# Patient Record
Sex: Female | Born: 1935 | Race: White | Hispanic: No | Marital: Married | Smoking: Never smoker
Health system: Southern US, Community
[De-identification: ages and names within clinical notes are randomized; demographics above are authoritative.]

## PROBLEM LIST (undated history)

## (undated) DIAGNOSIS — K219 Gastro-esophageal reflux disease without esophagitis: Secondary | ICD-10-CM

## (undated) DIAGNOSIS — I10 Essential (primary) hypertension: Secondary | ICD-10-CM

## (undated) DIAGNOSIS — M1990 Unspecified osteoarthritis, unspecified site: Secondary | ICD-10-CM

## (undated) DIAGNOSIS — K529 Noninfective gastroenteritis and colitis, unspecified: Secondary | ICD-10-CM

## (undated) HISTORY — PX: EYE SURGERY: SHX253

## (undated) HISTORY — PX: FRACTURE SURGERY: SHX138

## (undated) HISTORY — PX: SHOULDER ARTHROSCOPY W/ ROTATOR CUFF REPAIR: SHX2400

## (undated) HISTORY — PX: WRIST FRACTURE SURGERY: SHX121

## (undated) HISTORY — PX: ABDOMINAL HYSTERECTOMY: SHX81

---

## 2003-02-26 ENCOUNTER — Ambulatory Visit (HOSPITAL_COMMUNITY): Admission: RE | Admit: 2003-02-26 | Discharge: 2003-02-27 | Payer: Self-pay | Admitting: Orthopedic Surgery

## 2003-02-26 ENCOUNTER — Encounter: Payer: Self-pay | Admitting: Orthopedic Surgery

## 2004-10-13 ENCOUNTER — Ambulatory Visit: Payer: Self-pay | Admitting: Gastroenterology

## 2005-08-23 ENCOUNTER — Ambulatory Visit: Payer: Self-pay | Admitting: Family Medicine

## 2006-10-04 ENCOUNTER — Ambulatory Visit: Payer: Self-pay | Admitting: Family Medicine

## 2006-10-07 ENCOUNTER — Ambulatory Visit: Payer: Self-pay | Admitting: Family Medicine

## 2007-04-20 ENCOUNTER — Ambulatory Visit: Payer: Self-pay | Admitting: Family Medicine

## 2007-11-02 ENCOUNTER — Ambulatory Visit: Payer: Self-pay | Admitting: Family Medicine

## 2008-11-28 ENCOUNTER — Ambulatory Visit: Payer: Self-pay | Admitting: Family Medicine

## 2009-09-15 ENCOUNTER — Ambulatory Visit: Payer: Self-pay | Admitting: Internal Medicine

## 2009-10-07 ENCOUNTER — Ambulatory Visit: Payer: Self-pay | Admitting: Orthopedic Surgery

## 2009-10-13 ENCOUNTER — Ambulatory Visit: Payer: Self-pay | Admitting: Orthopedic Surgery

## 2009-10-14 ENCOUNTER — Ambulatory Visit: Payer: Self-pay | Admitting: Orthopedic Surgery

## 2009-11-17 ENCOUNTER — Encounter: Payer: Self-pay | Admitting: Orthopedic Surgery

## 2009-11-22 ENCOUNTER — Encounter: Payer: Self-pay | Admitting: Orthopedic Surgery

## 2009-12-23 ENCOUNTER — Encounter: Payer: Self-pay | Admitting: Orthopedic Surgery

## 2010-01-08 ENCOUNTER — Ambulatory Visit: Payer: Self-pay | Admitting: Family Medicine

## 2010-01-26 ENCOUNTER — Ambulatory Visit: Payer: Self-pay | Admitting: Gastroenterology

## 2010-08-15 ENCOUNTER — Ambulatory Visit: Payer: Self-pay | Admitting: Internal Medicine

## 2011-01-14 ENCOUNTER — Ambulatory Visit: Payer: Self-pay | Admitting: Family Medicine

## 2011-10-19 ENCOUNTER — Ambulatory Visit: Payer: Self-pay | Admitting: Internal Medicine

## 2012-01-18 ENCOUNTER — Ambulatory Visit: Payer: Self-pay | Admitting: Family Medicine

## 2013-01-18 ENCOUNTER — Ambulatory Visit: Payer: Self-pay | Admitting: Family Medicine

## 2013-04-11 ENCOUNTER — Ambulatory Visit: Payer: Self-pay | Admitting: Gastroenterology

## 2013-05-23 ENCOUNTER — Ambulatory Visit: Payer: Self-pay | Admitting: Ophthalmology

## 2013-06-20 ENCOUNTER — Ambulatory Visit: Payer: Self-pay | Admitting: Ophthalmology

## 2014-01-22 ENCOUNTER — Ambulatory Visit: Payer: Self-pay | Admitting: Family Medicine

## 2015-01-28 ENCOUNTER — Ambulatory Visit: Payer: Self-pay | Admitting: Family Medicine

## 2015-03-27 ENCOUNTER — Ambulatory Visit: Payer: Medicare PPO | Admitting: Anesthesiology

## 2015-03-27 ENCOUNTER — Encounter: Payer: Self-pay | Admitting: Anesthesiology

## 2015-03-27 ENCOUNTER — Ambulatory Visit
Admission: RE | Admit: 2015-03-27 | Discharge: 2015-03-27 | Disposition: A | Discharge status: Home / Self Care | Payer: Medicare PPO | Source: Ambulatory Visit | Attending: Gastroenterology | Admitting: Gastroenterology

## 2015-03-27 ENCOUNTER — Encounter
Admission: RE | Disposition: A | Discharge status: Home / Self Care | Payer: Self-pay | Source: Ambulatory Visit | Attending: Gastroenterology

## 2015-03-27 DIAGNOSIS — Z886 Allergy status to analgesic agent status: Secondary | ICD-10-CM | POA: Diagnosis not present

## 2015-03-27 DIAGNOSIS — D122 Benign neoplasm of ascending colon: Secondary | ICD-10-CM | POA: Insufficient documentation

## 2015-03-27 DIAGNOSIS — Z888 Allergy status to other drugs, medicaments and biological substances status: Secondary | ICD-10-CM | POA: Diagnosis not present

## 2015-03-27 DIAGNOSIS — K52832 Lymphocytic colitis: Secondary | ICD-10-CM

## 2015-03-27 DIAGNOSIS — Z880 Allergy status to penicillin: Secondary | ICD-10-CM | POA: Diagnosis not present

## 2015-03-27 DIAGNOSIS — K5289 Other specified noninfective gastroenteritis and colitis: Secondary | ICD-10-CM | POA: Diagnosis present

## 2015-03-27 DIAGNOSIS — I10 Essential (primary) hypertension: Secondary | ICD-10-CM | POA: Insufficient documentation

## 2015-03-27 HISTORY — PX: COLONOSCOPY: SHX5424

## 2015-03-27 HISTORY — DX: Gastro-esophageal reflux disease without esophagitis: K21.9

## 2015-03-27 HISTORY — DX: Unspecified osteoarthritis, unspecified site: M19.90

## 2015-03-27 HISTORY — DX: Essential (primary) hypertension: I10

## 2015-03-27 SURGERY — COLONOSCOPY
Anesthesia: Monitor Anesthesia Care

## 2015-03-27 MED ORDER — FENTANYL CITRATE (PF) 100 MCG/2ML IJ SOLN
INTRAMUSCULAR | Status: DC | PRN
  Administered 2015-03-27: 50 ug via INTRAVENOUS

## 2015-03-27 MED ORDER — LACTATED RINGERS IV SOLN
INTRAVENOUS | Status: DC | PRN
  Administered 2015-03-27: 08:00:00 via INTRAVENOUS

## 2015-03-27 MED ORDER — PROPOFOL INFUSION 10 MG/ML OPTIME
INTRAVENOUS | Status: DC | PRN
  Administered 2015-03-27: 75 ug/kg/min via INTRAVENOUS

## 2015-03-27 MED ORDER — SODIUM CHLORIDE 0.9 % IV SOLN: INTRAVENOUS | Status: DC

## 2015-03-27 MED ORDER — PROPOFOL 10 MG/ML IV BOLUS
INTRAVENOUS | Status: DC | PRN
  Administered 2015-03-27: 30 mg via INTRAVENOUS

## 2015-03-27 MED ORDER — SODIUM CHLORIDE 0.9 % IV SOLN
INTRAVENOUS | Status: DC
  Administered 2015-03-27: 08:00:00 via INTRAVENOUS

## 2015-03-27 MED ORDER — MIDAZOLAM HCL 2 MG/2ML IJ SOLN
INTRAMUSCULAR | Status: DC | PRN
  Administered 2015-03-27: 1 mg via INTRAVENOUS

## 2015-03-27 NOTE — Anesthesia Postprocedure Evaluation (Signed)
  Anesthesia Post-op Note  Patient: [place]  Procedure(s) Performed: Procedure(s): COLONOSCOPY (N/A)  Anesthesia type:MAC, General  Patient location: PACU  Post pain: Pain level controlled  Post assessment: Post-op Vital signs reviewed, Patient's Cardiovascular Status Stable, Respiratory Function Stable, Patent Airway and No signs of Nausea or vomiting  Post vital signs: Reviewed and stable  Last Vitals:  Filed Vitals:   03/27/15 0848  BP: 130/65  Pulse: 63  Temp: 37 C  Resp:     Level of consciousness: awake, alert  and patient cooperative  Complications: No apparent anesthesia complications

## 2015-03-27 NOTE — Transfer of Care (Signed)
Immediate Anesthesia Transfer of Care Note  Patient: Melanie Salazar  Procedure(s) Performed: Procedure(s): COLONOSCOPY (N/A)  Patient Location: PACU  Anesthesia Type:General  Level of Consciousness: sedated  Airway & Oxygen Therapy: Patient Spontanous Breathing  Post-op Assessment: Report given to RN  Post vital signs: Reviewed  Last Vitals:  Filed Vitals:   03/27/15 0848  BP: 130/65  Pulse: 63  Temp: 37 C  Resp:     Complications: No apparent anesthesia complications

## 2015-03-27 NOTE — Anesthesia Preprocedure Evaluation (Signed)
Anesthesia Evaluation  Patient identified by MRN, date of birth, ID band Patient awake    Reviewed: Allergy & Precautions, NPO status , Patient's Chart, lab work & pertinent test results, reviewed documented beta blocker date and time   Airway Mallampati: II  TM Distance: >3 FB     Dental  (+) Chipped   Pulmonary          Cardiovascular hypertension,     Neuro/Psych    GI/Hepatic GERD-  ,  Endo/Other    Renal/GU      Musculoskeletal   Abdominal   Peds  Hematology   Anesthesia Other Findings   Reproductive/Obstetrics                             Anesthesia Physical Anesthesia Plan  ASA: III  Anesthesia Plan: MAC and General   Post-op Pain Management:    Induction: Intravenous  Airway Management Planned: Nasal Cannula  Additional Equipment:   Intra-op Plan:   Post-operative Plan:   Informed Consent: I have reviewed the patients History and Physical, chart, labs and discussed the procedure including the risks, benefits and alternatives for the proposed anesthesia with the patient or authorized representative who has indicated his/her understanding and acceptance.     Plan Discussed with: CRNA and Surgeon  Anesthesia Plan Comments:         Anesthesia Quick Evaluation

## 2015-03-27 NOTE — Op Note (Signed)
[facility] Gastroenterology Patient Name: Melanie Salazar Procedure Date: 03/27/2015 7:51 AM MRN: [id number] Account #: [id number] Date of Birth: Apr 06, 1931 Admit Type: Outpatient Age: 81 Room: [facility] ENDO ROOM 4 Gender: Female Note Status: Finalized Procedure:         Colonoscopy Indications:       Hx of lymphocytic colitis. Stable on no meds. Providers:         Lupita Dawn. Candace Cruise, MD Referring MD:      Forest Gleason Md, MD (Referring MD) Medicines:         Monitored Anesthesia Care Complications:     No immediate complications. Procedure:         Pre-Anesthesia Assessment:                    - Prior to the procedure, a History and Physical was                     performed, and patient medications, allergies and                     sensitivities were reviewed. The patient's tolerance of                     previous anesthesia was reviewed.                    - The risks and benefits of the procedure and the sedation                     options and risks were discussed with the patient. All                     questions were answered and informed consent was obtained.                    - After reviewing the risks and benefits, the patient was                     deemed in satisfactory condition to undergo the procedure.                    After obtaining informed consent, the colonoscope was                     passed under direct vision. Throughout the procedure, the                     patient's blood pressure, pulse, and oxygen saturations                     were monitored continuously. The Colonoscope was                     introduced through the anus and advanced to the the cecum,                     identified by appendiceal orifice and ileocecal valve. The                     colonoscopy was performed without difficulty. The patient                     tolerated the procedure well. The quality of the bowel  preparation was fair. Findings:      A  diminutive polyp was found in the ascending colon. The polyp was       sessile. The polyp was removed with a jumbo cold forceps. Resection and       retrieval were complete. Biopsies were taken with a cold forceps for       histology. Biopsies for histology were taken with a cold forceps from       the entire colon for evaluation of microscopic colitis.      The exam was otherwise without abnormality. Impression:        - One diminutive polyp in the ascending colon. Resected                     and retrieved. Biopsied.                    - The examination was otherwise normal. Recommendation:    - Discharge patient to home.                    - Await pathology results.                    - The findings and recommendations were discussed with the                     patient. Procedure Code(s): --- Professional ---                    [phone], Colonoscopy, flexible; with biopsy, single or                     multiple Diagnosis Code(s): --- Professional ---                    D12.2, Benign neoplasm of ascending colon CPT copyright 2014 American Medical Association. All rights reserved. The codes documented in this report are preliminary and upon coder review may  be revised to meet current compliance requirements. Hulen Luster, MD 03/27/2015 8:35:42 AM This report has been signed electronically. Number of Addenda: 0 Note Initiated On: 03/27/2015 7:51 AM Scope Withdrawal Time: 0 hours 6 minutes 1 second  Total Procedure Duration: 0 hours 9 minutes 19 seconds       [facility]

## 2015-03-27 NOTE — H&P (Signed)
Hx of lymphocytic colitis.   PMH: HTN Allergies: NSIADS, PCN, ACE inhibitors  PE: NAD, VSS CV:RRR LUngs:CTA Abdomen: NABS, soft, nontender, -HSM Ext:-C/C/E  Imp: Hx of lymphocytic colitis Plan: Colonoscopy

## 2015-03-28 ENCOUNTER — Encounter: Payer: Self-pay | Admitting: Gastroenterology

## 2015-03-28 LAB — SURGICAL PATHOLOGY

## 2015-12-19 ENCOUNTER — Other Ambulatory Visit: Payer: Self-pay | Admitting: Surgery

## 2015-12-19 DIAGNOSIS — M75101 Unspecified rotator cuff tear or rupture of right shoulder, not specified as traumatic: Secondary | ICD-10-CM

## 2015-12-19 DIAGNOSIS — M12811 Other specific arthropathies, not elsewhere classified, right shoulder: Secondary | ICD-10-CM

## 2015-12-19 DIAGNOSIS — M75121 Complete rotator cuff tear or rupture of right shoulder, not specified as traumatic: Secondary | ICD-10-CM

## 2016-01-02 ENCOUNTER — Ambulatory Visit
Admission: RE | Admit: 2016-01-02 | Discharge: 2016-01-02 | Disposition: A | Discharge status: Home / Self Care | Payer: Medicare Other | Source: Ambulatory Visit | Attending: Surgery | Admitting: Surgery

## 2016-01-02 DIAGNOSIS — M75121 Complete rotator cuff tear or rupture of right shoulder, not specified as traumatic: Secondary | ICD-10-CM | POA: Insufficient documentation

## 2016-01-02 DIAGNOSIS — M19011 Primary osteoarthritis, right shoulder: Secondary | ICD-10-CM | POA: Insufficient documentation

## 2016-01-02 DIAGNOSIS — M75101 Unspecified rotator cuff tear or rupture of right shoulder, not specified as traumatic: Secondary | ICD-10-CM

## 2016-01-02 DIAGNOSIS — M12811 Other specific arthropathies, not elsewhere classified, right shoulder: Secondary | ICD-10-CM | POA: Insufficient documentation

## 2016-01-19 ENCOUNTER — Other Ambulatory Visit: Payer: Self-pay | Admitting: Family Medicine

## 2016-01-19 DIAGNOSIS — Z1231 Encounter for screening mammogram for malignant neoplasm of breast: Secondary | ICD-10-CM

## 2016-01-29 ENCOUNTER — Ambulatory Visit
Admission: RE | Admit: 2016-01-29 | Discharge: 2016-01-29 | Disposition: A | Discharge status: Home / Self Care | Payer: Medicare Other | Source: Ambulatory Visit | Attending: Family Medicine | Admitting: Family Medicine

## 2016-01-29 DIAGNOSIS — Z1231 Encounter for screening mammogram for malignant neoplasm of breast: Secondary | ICD-10-CM | POA: Diagnosis not present

## 2016-02-16 ENCOUNTER — Other Ambulatory Visit: Payer: Medicare Other

## 2016-03-25 ENCOUNTER — Ambulatory Visit
Admission: RE | Admit: 2016-03-25 | Discharge: 2016-03-25 | Disposition: A | Discharge status: Home / Self Care | Payer: Medicare Other | Source: Ambulatory Visit | Attending: Surgery | Admitting: Surgery

## 2016-03-25 ENCOUNTER — Encounter
Admission: RE | Admit: 2016-03-25 | Discharge: 2016-03-25 | Disposition: A | Discharge status: Home / Self Care | Payer: Medicare Other | Source: Ambulatory Visit | Attending: Surgery | Admitting: Surgery

## 2016-03-25 DIAGNOSIS — M24811 Other specific joint derangements of right shoulder, not elsewhere classified: Secondary | ICD-10-CM | POA: Diagnosis not present

## 2016-03-25 DIAGNOSIS — Z01818 Encounter for other preprocedural examination: Secondary | ICD-10-CM | POA: Insufficient documentation

## 2016-03-25 DIAGNOSIS — I10 Essential (primary) hypertension: Secondary | ICD-10-CM

## 2016-03-25 HISTORY — DX: Noninfective gastroenteritis and colitis, unspecified: K52.9

## 2016-03-25 LAB — URINALYSIS COMPLETE WITH MICROSCOPIC (ARMC ONLY)
Bilirubin Urine: NEGATIVE
Glucose, UA: NEGATIVE mg/dL
HGB URINE DIPSTICK: NEGATIVE
Ketones, ur: NEGATIVE mg/dL
NITRITE: NEGATIVE
PH: 8 (ref 5.0–8.0)
PROTEIN: NEGATIVE mg/dL
Specific Gravity, Urine: 1.004 — ABNORMAL LOW (ref 1.005–1.030)

## 2016-03-25 LAB — CBC
HEMATOCRIT: 41.6 % (ref 35.0–47.0)
Hemoglobin: 14.2 g/dL (ref 12.0–16.0)
MCH: 30 pg (ref 26.0–34.0)
MCHC: 34.1 g/dL (ref 32.0–36.0)
MCV: 87.8 fL (ref 80.0–100.0)
Platelets: 444 10*3/uL — ABNORMAL HIGH (ref 150–440)
RBC: 4.74 MIL/uL (ref 3.80–5.20)
RDW: 14.9 % — AB (ref 11.5–14.5)
WBC: 5 10*3/uL (ref 3.6–11.0)

## 2016-03-25 LAB — TYPE AND SCREEN
ABO/RH(D): O NEG
Antibody Screen: NEGATIVE

## 2016-03-25 LAB — BASIC METABOLIC PANEL
ANION GAP: 8 (ref 5–15)
BUN: 14 mg/dL (ref 6–20)
CALCIUM: 9.7 mg/dL (ref 8.9–10.3)
CO2: 28 mmol/L (ref 22–32)
Chloride: 102 mmol/L (ref 101–111)
Creatinine, Ser: 0.54 mg/dL (ref 0.44–1.00)
Glucose, Bld: 100 mg/dL — ABNORMAL HIGH (ref 65–99)
Potassium: 3.7 mmol/L (ref 3.5–5.1)
Sodium: 138 mmol/L (ref 135–145)

## 2016-03-25 LAB — ABO/RH: ABO/RH(D): O NEG

## 2016-03-25 LAB — SURGICAL PCR SCREEN
MRSA, PCR: NEGATIVE
Staphylococcus aureus: NEGATIVE

## 2016-03-25 LAB — PROTIME-INR
INR: 0.93
PROTHROMBIN TIME: 12.7 s (ref 11.4–15.0)

## 2016-03-25 NOTE — Patient Instructions (Signed)
  Your procedure is scheduled on: Apr 06, 2016 (Tuesday) Report to Day Surgery.([facility]) SECOND FLOOR To find out your arrival time please call [phone] between 1PM - 3PM on Apr 05, 2016 (Monday).  Remember: Instructions that are not followed completely may result in serious medical risk, up to and including death, or upon the discretion of your surgeon and anesthesiologist your surgery may need to be rescheduled.    __x__ 1. Do not eat food or drink liquids after midnight. No gum chewing or hard candies.     __x__ 2. No Alcohol for 24 hours before or after surgery.   ____ 3. Bring all medications with you on the day of surgery if instructed.    __x__ 4. Notify your doctor if there is any change in your medical condition     (cold, fever, infections).     Do not wear jewelry, make-up, hairpins, clips or nail polish.  Do not wear lotions, powders, or perfumes. You may wear deodorant.  Do not shave 48 hours prior to surgery. Men may shave face and neck.  Do not bring valuables to the hospital.    [facility] is not responsible for any belongings or valuables.               Contacts, dentures or bridgework may not be worn into surgery.  Leave your suitcase in the car. After surgery it may be brought to your room.  For patients admitted to the hospital, discharge time is determined by your                treatment team.   Patients discharged the day of surgery will not be allowed to drive home.   Please read over the following fact sheets that you were given:   MRSA Information and Surgical Site Infection Prevention   __x__ Take these medicines the morning of surgery with A SIP OF WATER:    1. Amlodipine  2. Atenolol  3. Omeprazole  4.  5.  6.  ____ Fleet Enema (as directed)   __x__ Use CHG Soap as directed  ____ Use inhalers on the day of surgery  ____ Stop metformin 2 days prior to surgery    ____ Take 1/2 of usual insulin dose the night before surgery and none  on the morning of surgery.   _x___ Stop Coumadin/Plavix/aspirin on (Stop Aspirin one week prior to surgery)  _x___ Stop Anti-inflammatories on (NO NSAIDS) Tylenol ok to take for pain if needed   _x___ Stop supplements until after surgery.  (Stop Preservision now)  ____ Bring C-Pap to the hospital.

## 2016-03-26 NOTE — Pre-Procedure Instructions (Signed)
UA results sent to Dr. Poggi for review. 

## 2016-03-27 LAB — URINE CULTURE: Culture: 6000 — AB

## 2016-04-06 ENCOUNTER — Encounter: Payer: Self-pay | Admitting: Emergency Medicine

## 2016-04-06 ENCOUNTER — Inpatient Hospital Stay: Payer: Medicare Other | Admitting: Anesthesiology

## 2016-04-06 ENCOUNTER — Encounter
Admission: AD | Disposition: A | Discharge status: Home Health | Payer: Self-pay | Source: Ambulatory Visit | Attending: Surgery

## 2016-04-06 ENCOUNTER — Inpatient Hospital Stay
Admission: AD | Admit: 2016-04-06 | Discharge: 2016-04-07 | DRG: 483 | Disposition: A | Discharge status: Home Health | Payer: Medicare Other | Source: Ambulatory Visit | Attending: Surgery | Admitting: Surgery

## 2016-04-06 ENCOUNTER — Observation Stay: Payer: Medicare Other

## 2016-04-06 DIAGNOSIS — Z79899 Other long term (current) drug therapy: Secondary | ICD-10-CM

## 2016-04-06 DIAGNOSIS — Z888 Allergy status to other drugs, medicaments and biological substances status: Secondary | ICD-10-CM | POA: Diagnosis not present

## 2016-04-06 DIAGNOSIS — M75121 Complete rotator cuff tear or rupture of right shoulder, not specified as traumatic: Principal | ICD-10-CM | POA: Diagnosis present

## 2016-04-06 DIAGNOSIS — Z7982 Long term (current) use of aspirin: Secondary | ICD-10-CM | POA: Diagnosis not present

## 2016-04-06 DIAGNOSIS — Z8042 Family history of malignant neoplasm of prostate: Secondary | ICD-10-CM | POA: Diagnosis not present

## 2016-04-06 DIAGNOSIS — Z8249 Family history of ischemic heart disease and other diseases of the circulatory system: Secondary | ICD-10-CM

## 2016-04-06 DIAGNOSIS — Z96611 Presence of right artificial shoulder joint: Secondary | ICD-10-CM

## 2016-04-06 DIAGNOSIS — K219 Gastro-esophageal reflux disease without esophagitis: Secondary | ICD-10-CM | POA: Diagnosis present

## 2016-04-06 DIAGNOSIS — I10 Essential (primary) hypertension: Secondary | ICD-10-CM | POA: Diagnosis present

## 2016-04-06 DIAGNOSIS — Z96619 Presence of unspecified artificial shoulder joint: Secondary | ICD-10-CM

## 2016-04-06 DIAGNOSIS — Z8052 Family history of malignant neoplasm of bladder: Secondary | ICD-10-CM | POA: Diagnosis not present

## 2016-04-06 DIAGNOSIS — M810 Age-related osteoporosis without current pathological fracture: Secondary | ICD-10-CM | POA: Diagnosis present

## 2016-04-06 DIAGNOSIS — Z880 Allergy status to penicillin: Secondary | ICD-10-CM | POA: Diagnosis not present

## 2016-04-06 HISTORY — PX: REVERSE SHOULDER ARTHROPLASTY: SHX5054

## 2016-04-06 SURGERY — ARTHROPLASTY, SHOULDER, TOTAL, REVERSE
Anesthesia: Regional | Laterality: Right

## 2016-04-06 MED ORDER — LORATADINE 10 MG PO TABS
10.0000 mg | ORAL_TABLET | ORAL | Status: DC
  Administered 2016-04-07: 10 mg via ORAL
  Filled 2016-04-06: qty 1

## 2016-04-06 MED ORDER — BUPIVACAINE-EPINEPHRINE (PF) 0.5% -1:200000 IJ SOLN
INTRAMUSCULAR | Status: AC
  Filled 2016-04-06: qty 60

## 2016-04-06 MED ORDER — ACETAMINOPHEN 650 MG RE SUPP: 650.0000 mg | Freq: Four times a day (QID) | RECTAL | Status: DC | PRN

## 2016-04-06 MED ORDER — CYCLOSPORINE 0.05 % OP EMUL
1.0000 [drp] | Freq: Two times a day (BID) | OPHTHALMIC | Status: DC
  Administered 2016-04-06 – 2016-04-07 (×3): 1 [drp] via OPHTHALMIC
  Filled 2016-04-06 (×4): qty 1

## 2016-04-06 MED ORDER — BUPIVACAINE LIPOSOME 1.3 % IJ SUSP
INTRAMUSCULAR | Status: AC
  Filled 2016-04-06: qty 20

## 2016-04-06 MED ORDER — TRANEXAMIC ACID 1000 MG/10ML IV SOLN
INTRAVENOUS | Status: DC | PRN
  Administered 2016-04-06: 1000 mg via INTRAVENOUS

## 2016-04-06 MED ORDER — ROCURONIUM BROMIDE 10 MG/ML (PF) SYRINGE
PREFILLED_SYRINGE | INTRAVENOUS | Status: DC | PRN
Start: 2016-04-06 — End: 2016-04-06

## 2016-04-06 MED ORDER — METOCLOPRAMIDE HCL 10 MG PO TABS: 5.0000 mg | ORAL_TABLET | Freq: Three times a day (TID) | ORAL | Status: DC | PRN

## 2016-04-06 MED ORDER — OCUVITE-LUTEIN PO CAPS
1.0000 | ORAL_CAPSULE | ORAL | Status: DC
  Administered 2016-04-07: 1 via ORAL
  Filled 2016-04-06: qty 1

## 2016-04-06 MED ORDER — OXYCODONE HCL 5 MG PO TABS
5.0000 mg | ORAL_TABLET | ORAL | Status: DC | PRN
  Filled 2016-04-06: qty 1

## 2016-04-06 MED ORDER — PANTOPRAZOLE SODIUM 40 MG PO TBEC
40.0000 mg | DELAYED_RELEASE_TABLET | ORAL | Status: DC
  Administered 2016-04-07: 40 mg via ORAL
  Filled 2016-04-06: qty 1

## 2016-04-06 MED ORDER — ASPIRIN 81 MG PO CHEW
81.0000 mg | CHEWABLE_TABLET | Freq: Every day | ORAL | Status: DC
  Administered 2016-04-06 – 2016-04-07 (×2): 81 mg via ORAL
  Filled 2016-04-06 (×2): qty 1

## 2016-04-06 MED ORDER — ACETAMINOPHEN 325 MG PO TABS: 650.0000 mg | ORAL_TABLET | Freq: Four times a day (QID) | ORAL | Status: DC | PRN

## 2016-04-06 MED ORDER — ACETAMINOPHEN 10 MG/ML IV SOLN
INTRAVENOUS | Status: AC
  Filled 2016-04-06: qty 100

## 2016-04-06 MED ORDER — HYDROMORPHONE HCL 1 MG/ML IJ SOLN: 0.5000 mg | INTRAMUSCULAR | Status: DC | PRN

## 2016-04-06 MED ORDER — FENTANYL CITRATE (PF) 100 MCG/2ML IJ SOLN
25.0000 ug | INTRAMUSCULAR | Status: DC | PRN
  Administered 2016-04-06 (×4): 25 ug via INTRAVENOUS

## 2016-04-06 MED ORDER — KCL IN DEXTROSE-NACL 20-5-0.9 MEQ/L-%-% IV SOLN
INTRAVENOUS | Status: DC
  Administered 2016-04-06: 13:00:00 via INTRAVENOUS
  Filled 2016-04-06 (×3): qty 1000

## 2016-04-06 MED ORDER — CLINDAMYCIN PHOSPHATE 900 MG/50ML IV SOLN
900.0000 mg | Freq: Once | INTRAVENOUS | Status: AC
  Administered 2016-04-06: 900 mg via INTRAVENOUS

## 2016-04-06 MED ORDER — AMLODIPINE BESYLATE 5 MG PO TABS
5.0000 mg | ORAL_TABLET | Freq: Every day | ORAL | Status: DC
  Administered 2016-04-07: 5 mg via ORAL
  Filled 2016-04-06 (×2): qty 1

## 2016-04-06 MED ORDER — ONDANSETRON HCL 4 MG/2ML IJ SOLN: 4.0000 mg | Freq: Once | INTRAMUSCULAR | Status: DC | PRN

## 2016-04-06 MED ORDER — CALCIUM CARBONATE ANTACID 500 MG PO CHEW
1.0000 | CHEWABLE_TABLET | Freq: Two times a day (BID) | ORAL | Status: DC
  Administered 2016-04-06 – 2016-04-07 (×3): 200 mg via ORAL
  Filled 2016-04-06 (×3): qty 1

## 2016-04-06 MED ORDER — HYDROCHLOROTHIAZIDE 12.5 MG PO CAPS
12.5000 mg | ORAL_CAPSULE | Freq: Every day | ORAL | Status: DC
  Administered 2016-04-06 – 2016-04-07 (×2): 12.5 mg via ORAL
  Filled 2016-04-06 (×2): qty 1

## 2016-04-06 MED ORDER — FLUTICASONE PROPIONATE 50 MCG/ACT NA SUSP
2.0000 | Freq: Every day | NASAL | Status: DC
  Administered 2016-04-06 – 2016-04-07 (×2): 2 via NASAL
  Filled 2016-04-06: qty 16

## 2016-04-06 MED ORDER — DOCUSATE SODIUM 100 MG PO CAPS
100.0000 mg | ORAL_CAPSULE | Freq: Two times a day (BID) | ORAL | Status: DC
  Administered 2016-04-06 – 2016-04-07 (×3): 100 mg via ORAL
  Filled 2016-04-06 (×3): qty 1

## 2016-04-06 MED ORDER — MAGNESIUM HYDROXIDE 400 MG/5ML PO SUSP: 30.0000 mL | Freq: Every day | ORAL | Status: DC | PRN

## 2016-04-06 MED ORDER — ATENOLOL 50 MG PO TABS
100.0000 mg | ORAL_TABLET | Freq: Every day | ORAL | Status: DC
  Administered 2016-04-07: 100 mg via ORAL
  Filled 2016-04-06 (×2): qty 2

## 2016-04-06 MED ORDER — ONDANSETRON HCL 4 MG/2ML IJ SOLN
INTRAMUSCULAR | Status: DC | PRN
  Administered 2016-04-06: 4 mg via INTRAVENOUS

## 2016-04-06 MED ORDER — ADULT MULTIVITAMIN W/MINERALS CH
1.0000 | ORAL_TABLET | Freq: Every day | ORAL | Status: DC
  Administered 2016-04-06 – 2016-04-07 (×2): 1 via ORAL
  Filled 2016-04-06 (×2): qty 1

## 2016-04-06 MED ORDER — ENOXAPARIN SODIUM 40 MG/0.4ML ~~LOC~~ SOLN
40.0000 mg | SUBCUTANEOUS | Status: DC
  Administered 2016-04-07: 40 mg via SUBCUTANEOUS
  Filled 2016-04-06: qty 0.4

## 2016-04-06 MED ORDER — PRAVASTATIN SODIUM 20 MG PO TABS
40.0000 mg | ORAL_TABLET | Freq: Every day | ORAL | Status: DC
  Administered 2016-04-06: 40 mg via ORAL
  Filled 2016-04-06: qty 2

## 2016-04-06 MED ORDER — LACTATED RINGERS IV SOLN
INTRAVENOUS | Status: DC
  Administered 2016-04-06: 07:00:00 via INTRAVENOUS

## 2016-04-06 MED ORDER — METOCLOPRAMIDE HCL 5 MG/ML IJ SOLN: 5.0000 mg | Freq: Three times a day (TID) | INTRAMUSCULAR | Status: DC | PRN

## 2016-04-06 MED ORDER — ROPIVACAINE HCL 5 MG/ML IJ SOLN
INTRAMUSCULAR | Status: AC
  Filled 2016-04-06: qty 20

## 2016-04-06 MED ORDER — NEOMYCIN-POLYMYXIN B GU 40-200000 IR SOLN
Status: AC
  Filled 2016-04-06: qty 20

## 2016-04-06 MED ORDER — SUGAMMADEX SODIUM 200 MG/2ML IV SOLN
INTRAVENOUS | Status: DC | PRN
  Administered 2016-04-06: 200 mg via INTRAVENOUS

## 2016-04-06 MED ORDER — PHENYLEPHRINE HCL 10 MG/ML IJ SOLN
10.0000 mg | INTRAVENOUS | Status: DC | PRN
  Administered 2016-04-06: 15 ug/min via INTRAVENOUS

## 2016-04-06 MED ORDER — MIDAZOLAM HCL 2 MG/2ML IJ SOLN
INTRAMUSCULAR | Status: DC | PRN
  Administered 2016-04-06: 0.5 mg via INTRAVENOUS

## 2016-04-06 MED ORDER — BISACODYL 10 MG RE SUPP: 10.0000 mg | Freq: Every day | RECTAL | Status: DC | PRN

## 2016-04-06 MED ORDER — FLEET ENEMA 7-19 GM/118ML RE ENEM: 1.0000 | ENEMA | Freq: Once | RECTAL | Status: DC | PRN

## 2016-04-06 MED ORDER — NEOMYCIN-POLYMYXIN B GU 40-200000 IR SOLN
Status: DC | PRN
  Administered 2016-04-06: 16 mL

## 2016-04-06 MED ORDER — LIDOCAINE HCL (CARDIAC) 20 MG/ML IV SOLN
INTRAVENOUS | Status: DC | PRN
  Administered 2016-04-06: 40 mg via INTRAVENOUS

## 2016-04-06 MED ORDER — PROPOFOL 10 MG/ML IV BOLUS
INTRAVENOUS | Status: DC | PRN
  Administered 2016-04-06: 130 mg via INTRAVENOUS

## 2016-04-06 MED ORDER — FENTANYL CITRATE (PF) 100 MCG/2ML IJ SOLN
INTRAMUSCULAR | Status: DC | PRN
  Administered 2016-04-06 (×2): 50 ug via INTRAVENOUS
  Administered 2016-04-06: 100 ug via INTRAVENOUS

## 2016-04-06 MED ORDER — CLINDAMYCIN PHOSPHATE 600 MG/50ML IV SOLN
600.0000 mg | Freq: Four times a day (QID) | INTRAVENOUS | Status: AC
  Administered 2016-04-06 (×3): 600 mg via INTRAVENOUS
  Filled 2016-04-06 (×3): qty 50

## 2016-04-06 MED ORDER — ACETAMINOPHEN 500 MG PO TABS
1000.0000 mg | ORAL_TABLET | Freq: Four times a day (QID) | ORAL | Status: AC
  Administered 2016-04-06 – 2016-04-07 (×4): 1000 mg via ORAL
  Filled 2016-04-06 (×4): qty 2

## 2016-04-06 MED ORDER — BUPIVACAINE-EPINEPHRINE (PF) 0.5% -1:200000 IJ SOLN
INTRAMUSCULAR | Status: DC | PRN
  Administered 2016-04-06: 30 mL via PERINEURAL

## 2016-04-06 MED ORDER — EPHEDRINE SULFATE 50 MG/ML IJ SOLN
INTRAMUSCULAR | Status: DC | PRN
  Administered 2016-04-06: 7.5 mg via INTRAVENOUS

## 2016-04-06 MED ORDER — VALSARTAN-HYDROCHLOROTHIAZIDE 80-12.5 MG PO TABS: 1.0000 | ORAL_TABLET | Freq: Every day | ORAL | Status: DC

## 2016-04-06 MED ORDER — SUCCINYLCHOLINE CHLORIDE 20 MG/ML IJ SOLN
INTRAMUSCULAR | Status: DC | PRN
  Administered 2016-04-06: 80 mg via INTRAVENOUS

## 2016-04-06 MED ORDER — ZOLPIDEM TARTRATE 5 MG PO TABS
5.0000 mg | ORAL_TABLET | Freq: Every evening | ORAL | Status: DC | PRN
  Administered 2016-04-06: 5 mg via ORAL
  Filled 2016-04-06: qty 1

## 2016-04-06 MED ORDER — NEOMYCIN-POLYMYXIN B GU 40-200000 IR SOLN
Status: AC
  Filled 2016-04-06: qty 4

## 2016-04-06 MED ORDER — ONDANSETRON HCL 4 MG/2ML IJ SOLN: 4.0000 mg | Freq: Four times a day (QID) | INTRAMUSCULAR | Status: DC | PRN

## 2016-04-06 MED ORDER — IRBESARTAN 75 MG PO TABS
75.0000 mg | ORAL_TABLET | Freq: Every day | ORAL | Status: DC
  Administered 2016-04-06 – 2016-04-07 (×2): 75 mg via ORAL
  Filled 2016-04-06 (×2): qty 1

## 2016-04-06 MED ORDER — ROCURONIUM BROMIDE 100 MG/10ML IV SOLN
INTRAVENOUS | Status: DC | PRN
  Administered 2016-04-06: 15 mg via INTRAVENOUS
  Administered 2016-04-06: 35 mg via INTRAVENOUS
  Administered 2016-04-06: 15 mg via INTRAVENOUS

## 2016-04-06 MED ORDER — TRANEXAMIC ACID 1000 MG/10ML IV SOLN
INTRAVENOUS | Status: AC
  Filled 2016-04-06: qty 10

## 2016-04-06 MED ORDER — ACETAMINOPHEN 10 MG/ML IV SOLN
INTRAVENOUS | Status: DC | PRN
  Administered 2016-04-06: 1000 mg via INTRAVENOUS

## 2016-04-06 MED ORDER — DIPHENHYDRAMINE HCL 12.5 MG/5ML PO ELIX
12.5000 mg | ORAL_SOLUTION | ORAL | Status: DC | PRN
  Filled 2016-04-06: qty 10

## 2016-04-06 MED ORDER — SODIUM CHLORIDE 0.9 % IJ SOLN
INTRAMUSCULAR | Status: AC
  Filled 2016-04-06: qty 50

## 2016-04-06 MED ORDER — SODIUM CHLORIDE 0.9 % IV SOLN
INTRAVENOUS | Status: DC | PRN
  Administered 2016-04-06: 60 mL

## 2016-04-06 MED ORDER — FENTANYL CITRATE (PF) 100 MCG/2ML IJ SOLN
INTRAMUSCULAR | Status: AC
  Administered 2016-04-06: 25 ug via INTRAVENOUS
  Filled 2016-04-06: qty 2

## 2016-04-06 MED ORDER — GLYCOPYRROLATE 0.2 MG/ML IJ SOLN
INTRAMUSCULAR | Status: DC | PRN
  Administered 2016-04-06: 0.2 mg via INTRAVENOUS
  Administered 2016-04-06: 0.1 mg via INTRAVENOUS

## 2016-04-06 MED ORDER — ONDANSETRON HCL 4 MG PO TABS: 4.0000 mg | ORAL_TABLET | Freq: Four times a day (QID) | ORAL | Status: DC | PRN

## 2016-04-06 SURGICAL SUPPLY — 68 items
BAG DECANTER FOR FLEXI CONT (MISCELLANEOUS) IMPLANT
BIT DRILL TWIST 2.7 (BIT) ×2 IMPLANT
BIT DRILL TWIST 2.7MM (BIT) ×1
BLADE SAGITTAL WIDE XTHICK NO (BLADE) ×3 IMPLANT
BOWL CEMENT MIX W/ADAPTER (MISCELLANEOUS) IMPLANT
CANISTER SUCT 1200ML W/VALVE (MISCELLANEOUS) ×3 IMPLANT
CANISTER SUCT 3000ML PPV (MISCELLANEOUS) ×6 IMPLANT
CAPT SHLDR REVTOTAL 2 ×3 IMPLANT
CATH TRAY METER 16FR LF (MISCELLANEOUS) ×3 IMPLANT
CHLORAPREP W/TINT 26ML (MISCELLANEOUS) ×6 IMPLANT
COOLER POLAR GLACIER W/PUMP (MISCELLANEOUS) ×3 IMPLANT
DECANTER SPIKE VIAL GLASS SM (MISCELLANEOUS) ×9 IMPLANT
DRAPE FLUOR MINI C-ARM 54X84 (DRAPES) IMPLANT
DRAPE IMP U-DRAPE 54X76 (DRAPES) ×6 IMPLANT
DRAPE INCISE IOBAN 66X45 STRL (DRAPES) ×6 IMPLANT
DRAPE INCISE IOBAN 66X60 STRL (DRAPES) ×3 IMPLANT
DRAPE SHEET LG 3/4 BI-LAMINATE (DRAPES) ×6 IMPLANT
DRAPE TABLE BACK 80X90 (DRAPES) ×3 IMPLANT
DRSG OPSITE POSTOP 4X8 (GAUZE/BANDAGES/DRESSINGS) ×3 IMPLANT
ELECT CAUTERY BLADE 6.4 (BLADE) ×3 IMPLANT
GAUZE PACK 2X3YD (MISCELLANEOUS) ×3 IMPLANT
GLOVE BIO SURGEON STRL SZ7.5 (GLOVE) ×6 IMPLANT
GLOVE BIO SURGEON STRL SZ8 (GLOVE) ×6 IMPLANT
GLOVE BIOGEL PI IND STRL 8 (GLOVE) ×5 IMPLANT
GLOVE BIOGEL PI INDICATOR 8 (GLOVE) ×10
GLOVE INDICATOR 8.0 STRL GRN (GLOVE) ×3 IMPLANT
GOWN STRL REUS W/ TWL LRG LVL3 (GOWN DISPOSABLE) ×2 IMPLANT
GOWN STRL REUS W/ TWL XL LVL3 (GOWN DISPOSABLE) ×1 IMPLANT
GOWN STRL REUS W/TWL LRG LVL3 (GOWN DISPOSABLE) ×4
GOWN STRL REUS W/TWL XL LVL3 (GOWN DISPOSABLE) ×2
HANDPIECE SUCTION TUBG SURGILV (MISCELLANEOUS) ×3 IMPLANT
HOOD PEEL AWAY FLYTE STAYCOOL (MISCELLANEOUS) ×9 IMPLANT
KIT RM TURNOVER STRD PROC AR (KITS) ×3 IMPLANT
KIT STABILIZATION SHOULDER (MISCELLANEOUS) ×3 IMPLANT
MASK FACE SPIDER DISP (MASK) ×3 IMPLANT
NDL MAYO CATGUT SZ1 (NEEDLE)
NDL MAYO CATGUT SZ5 (NEEDLE)
NDL SAFETY 22GX1.5 (NEEDLE) ×3 IMPLANT
NDL SUT 5 .5 CRC TPR PNT MAYO (NEEDLE) IMPLANT
NEEDLE 18GX1X1/2 (RX/OR ONLY) (NEEDLE) ×3 IMPLANT
NEEDLE HYPO 25X1 1.5 SAFETY (NEEDLE) ×3 IMPLANT
NEEDLE MAYO CATGUT SZ1 (NEEDLE) IMPLANT
NEEDLE MAYO CATGUT SZ4 (NEEDLE) IMPLANT
NEEDLE SPNL 20GX3.5 QUINCKE YW (NEEDLE) ×3 IMPLANT
NS IRRIG 1000ML POUR BTL (IV SOLUTION) ×3 IMPLANT
PACK ARTHROSCOPY SHOULDER (MISCELLANEOUS) ×3 IMPLANT
PAD WRAPON POLAR SHDR UNIV (MISCELLANEOUS) ×1 IMPLANT
PIN THREADED REVERSE (PIN) ×3 IMPLANT
SLING ULTRA II M (MISCELLANEOUS) IMPLANT
SOL .9 NS 3000ML IRR  AL (IV SOLUTION) ×2
SOL .9 NS 3000ML IRR UROMATIC (IV SOLUTION) ×1 IMPLANT
SPONGE LAP 18X18 5 PK (GAUZE/BANDAGES/DRESSINGS) IMPLANT
STAPLER SKIN PROX 35W (STAPLE) ×3 IMPLANT
SUT ETHIBOND #5 BRAIDED 30INL (SUTURE) ×3 IMPLANT
SUT ETHIBOND 0 MO6 C/R (SUTURE) ×3 IMPLANT
SUT ETHIBOND NAB CT1 #1 30IN (SUTURE) IMPLANT
SUT FIBERWIRE #2 38 BLUE 1/2 (SUTURE) ×9
SUT STEEL 5 (SUTURE) IMPLANT
SUT VIC AB 0 CT1 36 (SUTURE) ×6 IMPLANT
SUT VIC AB 2-0 CT1 27 (SUTURE) ×4
SUT VIC AB 2-0 CT1 TAPERPNT 27 (SUTURE) ×2 IMPLANT
SUTURE FIBERWR #2 38 BLUE 1/2 (SUTURE) ×3 IMPLANT
SYR 30ML LL (SYRINGE) ×6 IMPLANT
SYR TB 1ML LUER SLIP (SYRINGE) IMPLANT
SYRINGE 10CC LL (SYRINGE) ×3 IMPLANT
TUBE CONNECTING  6'X3/16 (MISCELLANEOUS) ×1
TUBE CONNECTING 6X3/16 (MISCELLANEOUS) ×2 IMPLANT
WRAPON POLAR PAD SHDR UNIV (MISCELLANEOUS) ×3

## 2016-04-06 NOTE — H&P (Signed)
Paper H&P to be scanned into permanent record. H&P reviewed. No changes. 

## 2016-04-06 NOTE — Anesthesia Postprocedure Evaluation (Signed)
Anesthesia Post Note  Patient: [place]  Procedure(s) Performed: Procedure(s) (LRB): REVERSE SHOULDER ARTHROPLASTY (Right)  Patient location during evaluation: PACU Anesthesia Type: General Level of consciousness: awake Pain management: satisfactory to patient Vital Signs Assessment: post-procedure vital signs reviewed and stable Respiratory status: spontaneous breathing Cardiovascular status: stable Anesthetic complications: no    Last Vitals:  Filed Vitals:   04/06/16 1037 04/06/16 1042  BP:  124/73  Pulse: 69 69  Temp:    Resp: 10 12    Last Pain:  Filed Vitals:   04/06/16 1045  PainSc: 5                  VAN STAVEREN,Tajana Crotteau

## 2016-04-06 NOTE — Care Management (Signed)
Met with patient and several family members to discuss discharge planning. She is from home with her husband. She was independent with mobility. She has no DME including O2 at home. List of home health agencies left with patient to review. RNCM will continue to follow.

## 2016-04-06 NOTE — Anesthesia Preprocedure Evaluation (Signed)
Anesthesia Evaluation  Patient identified by MRN, date of birth, ID band Patient awake    Reviewed: Allergy & Precautions, NPO status , Patient's Chart, lab work & pertinent test results  Airway Mallampati: III       Dental  (+) Teeth Intact   Pulmonary neg pulmonary ROS,    breath sounds clear to auscultation       Cardiovascular Exercise Tolerance: Good hypertension, Pt. on home beta blockers  Rhythm:Regular Rate:Normal     Neuro/Psych negative neurological ROS     GI/Hepatic Neg liver ROS, GERD  Medicated,  Endo/Other  negative endocrine ROS  Renal/GU negative Renal ROS     Musculoskeletal negative musculoskeletal ROS (+)   Abdominal (+) + obese,   Peds  Hematology negative hematology ROS (+)   Anesthesia Other Findings   Reproductive/Obstetrics                             Anesthesia Physical Anesthesia Plan  ASA: III  Anesthesia Plan: General and Regional   Post-op Pain Management:    Induction: Intravenous  Airway Management Planned: Oral ETT  Additional Equipment:   Intra-op Plan:   Post-operative Plan: Extubation in OR  Informed Consent: I have reviewed the patients History and Physical, chart, labs and discussed the procedure including the risks, benefits and alternatives for the proposed anesthesia with the patient or authorized representative who has indicated his/her understanding and acceptance.     Plan Discussed with: CRNA  Anesthesia Plan Comments:         Anesthesia Quick Evaluation

## 2016-04-06 NOTE — Transfer of Care (Signed)
Immediate Anesthesia Transfer of Care Note  Patient: [place]  Procedure(s) Performed: Procedure(s): REVERSE SHOULDER ARTHROPLASTY (Right)  Patient Location: PACU  Anesthesia Type:General  Level of Consciousness: awake, alert , oriented and patient cooperative  Airway & Oxygen Therapy: Patient Spontanous Breathing and Patient connected to nasal cannula oxygen  Post-op Assessment: Report given to RN and Post -op Vital signs reviewed and stable  Post vital signs: Reviewed and stable  Last Vitals:  Filed Vitals:   04/06/16 0634  BP: 170/85  Pulse: 63  Temp: 36.6 C  Resp: 20    Last Pain: There were no vitals filed for this visit.       Complications: No apparent anesthesia complications

## 2016-04-06 NOTE — Anesthesia Procedure Notes (Addendum)
Anesthesia Regional Block:  Interscalene brachial plexus block  Pre-Anesthetic Checklist: ,, timeout performed, Correct Patient, Correct Site, Correct Laterality, Correct Procedure, Correct Position, site marked, Risks and benefits discussed,  Surgical consent,  Pre-op evaluation,  At surgeon's request and post-op pain management  Laterality: Right  Prep: alcohol swabs       Needles:  Injection technique: Single-shot  Needle Type: Stimulator Needle - 80     Needle Length: 9cm 9 cm Needle Gauge: 22 and 22 G    Additional Needles:  Procedures: nerve stimulator and paresthesia technique Interscalene brachial plexus block  Nerve Stimulator or Paresthesia:  Response: yes, 0.4 mA, 1 ms, 2 cm  Additional Responses:   Narrative:  Start time: 04/06/2016 7:30 AM End time: 04/06/2016 7:38 AM  Performed by: Personally  Anesthesiologist: Marline Backbone F   Procedure Name: Intubation Date/Time: 04/06/2016 7:44 AM Performed by: Rosaria Ferries, Eriverto Byrnes Pre-anesthesia Checklist: Patient identified, Emergency Drugs available, Suction available and Patient being monitored Patient Re-evaluated:Patient Re-evaluated prior to inductionOxygen Delivery Method: Circle system utilized Preoxygenation: Pre-oxygenation with 100% oxygen Intubation Type: IV induction Laryngoscope Size: Mac and 3 Grade View: Grade III Tube type: Oral Tube size: 7.0 mm Number of attempts: 2 Airway Equipment and Method: Bougie stylet Placement Confirmation: positive ETCO2 and breath sounds checked- equal and bilateral Secured at: 21 cm Tube secured with: Tape Dental Injury: Teeth and Oropharynx as per pre-operative assessment  Future Recommendations: Recommend- induction with short-acting agent, and alternative techniques readily available

## 2016-04-06 NOTE — Op Note (Signed)
04/06/2016  10:07 AM  Patient:   Melanie Salazar  Pre-Op Diagnosis:   Massive irreparable rotator cuff tear with cuff arthropathy, right shoulder.   Post-Op Diagnosis:   Same.  Procedure:   Reverse right total shoulder arthroplasty.  Surgeon:   Pascal Lux, MD  Assistant:   Cameron Proud, PA-C  Anesthesia:   General endotracheal with an interscalene block placed preoperatively by the anesthesiologist.  Findings:   As above.  Complications:   None  EBL:  75 cc  Fluids:   900 cc crystalloid  UOP:  200 cc  TT:   None  Drains:   None  Closure:   Staples  Implants:   All press-fit Biomet Comprehensive system with a #10 mini-humeral stem, a 44 mm humeral tray with a standard insert, and a mini-base plate with a 36 mm glenosphere.  Brief Clinical Note:   The patient is an 82 year old female with a history of progressively worsening right shoulder pain. Her symptoms have persisted despite medications, activity modification, injections, etc. Her history and examination are consistent with a massive irreparable rotator cuff tear with cuff arthropathy, all of which were confirmed by MRI scan. She presents at this time for a reverse right total shoulder arthroplasty.  Procedure:   The patient was brought into the operating room and lain in the supine position on the OR table. After adequate IV sedation was achieved, an interscalene block was placed preoperatively by the anesthesiologist. The patient then underwent general endotracheal intubation and anesthesia before being repositioned in the beach chair position using the beach chair positioner. The right shoulder and upper extremity were prepped with ChloraPrep solution before being draped sterilely. Preoperative antibiotics were administered. A standard anterior approach to the shoulder was made through an approximately 4-5 inch incision. The incision was carried down through the subcutaneous tissues to expose the deltopectoral  fascia. The interval between the deltoid and pectoralis muscles was identified and this plane developed, retracting the cephalic vein laterally with the deltoid muscle. The conjoined tendon was identified. Its lateral margin was dissected and the Kolbel self-retraining retractor inserted. The "three sisters" were identified and cauterized. Bursal tissues were removed to improve visualization. The subscapularis tendon was released from its attachment to the lesser tuberosity 1 cm proximal to its insertion and several tagging sutures placed. The inferior capsule was released with care after identifying and protecting the axillary nerve. The proximal humeral cut was made at approximately 30 of retroversion using the extra-medullary guide.   Attention was redirected to the glenoid. The labrum was debrided circumferentially before the center of the glenoid was marked with electrocautery. The guidewire was drilled into the glenoid neck using the appropriate guide. After verifying its position, it was overreamed with the mini-baseplate reamer to create a flat surface. The permanent mini-baseplate was impacted into place. It was stabilized with a 45 mm central screw and four peripheral screws. Locking screws were placed superiorly and inferiorly while nonlocking screws were placed anteriorly and posteriorly. The permanent 36 mm glenosphere was then impacted into place and its Morse taper locking mechanism verified using manual distraction.  Attention was directed to the humeral side. The humeral canal was reamed sequentially beginning with the end-cutting reamer then progressing from a 4 mm reamer up to a 10 mm reamer. This provided excellent circumferential chatter. The canal was broached beginning with a #5 broach and progressing to a #10 broach. This was left in place and a trial reduction performed using the standard trial humeral  platform. The arm demonstrated excellent range of motion as the hand could be brought  across the chest to the opposite shoulder and brought to the top of the patient's head and to the patient's ear. The shoulder appeared stable throughout this range of motion. The joint was dislocated and the trial components removed. The permanent #10 mini-stem was impacted into place with care taken to maintain the appropriate version. The permanent 44 mm humeral platform with the standard insert was put together on the back table and impacted into place. Again, the [facility] taper locking mechanism was verified using manual distraction. The shoulder was relocated using two finger pressure and again placed through a range of motion with the findings as described above.  The wound was copiously irrigated with bacitracin saline solution using the jet lavage system before a total of 20 cc of Exparel diluted out to 60 cc with normal saline and 30 cc of 0.5% Sensorcaine with epinephrine was injected into the pericapsular and peri-incisional tissues to help with postoperative analgesia. The subscapularis tendon was reapproximated using #2 FiberWire interrupted sutures. The deltopectoral interval was closed using 2-0 Vicryl interrupted sutures before the subcutaneous tissues also were closed using 2-0 Vicryl interrupted sutures. The skin was closed using staples. Prior to closing the skin, 1 g of transexemic acid in 10 cc of normal saline was injected intra-articularly to help with postoperative bleeding. A sterile occlusive dressing was applied to the wound before the arm was placed into a shoulder immobilizer with an abduction pillow. A Polar Care system also was applied to the shoulder. The patient was then transferred back to a hospital bed before being awakened, extubated, and returned to the recovery room in satisfactory condition after tolerating the procedure well.

## 2016-04-07 LAB — BASIC METABOLIC PANEL WITH GFR
Anion gap: 7 (ref 5–15)
BUN: 6 mg/dL (ref 6–20)
CO2: 29 mmol/L (ref 22–32)
Calcium: 9.2 mg/dL (ref 8.9–10.3)
Chloride: 102 mmol/L (ref 101–111)
Creatinine, Ser: 0.39 mg/dL — ABNORMAL LOW (ref 0.44–1.00)
GFR calc Af Amer: >60 mL/min
GFR calc non Af Amer: >60 mL/min
Glucose, Bld: 123 mg/dL — ABNORMAL HIGH (ref 65–99)
Potassium: 3.6 mmol/L (ref 3.5–5.1)
Sodium: 138 mmol/L (ref 135–145)

## 2016-04-07 LAB — CBC WITH DIFFERENTIAL/PLATELET
BASOS ABS: 0 10*3/uL (ref 0–0.1)
Basophils Relative: 0 %
Eosinophils Absolute: 0 10*3/uL (ref 0–0.7)
Eosinophils Relative: 1 %
HEMATOCRIT: 38.1 % (ref 35.0–47.0)
Hemoglobin: 13.5 g/dL (ref 12.0–16.0)
Lymphs Abs: 0.5 10*3/uL — ABNORMAL LOW (ref 1.0–3.6)
MCH: 31 pg (ref 26.0–34.0)
MCHC: 35.3 g/dL (ref 32.0–36.0)
MCV: 87.7 fL (ref 80.0–100.0)
MONO ABS: 0.8 10*3/uL (ref 0.2–0.9)
Monocytes Relative: 10 %
NEUTROS ABS: 6.4 10*3/uL (ref 1.4–6.5)
Neutrophils Relative %: 82 %
Platelets: 412 10*3/uL (ref 150–440)
RBC: 4.35 MIL/uL (ref 3.80–5.20)
RDW: 14.8 % — AB (ref 11.5–14.5)
WBC: 7.8 10*3/uL (ref 3.6–11.0)

## 2016-04-07 MED ORDER — OXYCODONE HCL 5 MG PO TABS: 5.0000 mg | ORAL_TABLET | ORAL | Status: DC | PRN

## 2016-04-07 NOTE — Progress Notes (Signed)
Subjective: 1 Day Post-Op Procedure(s) (LRB): REVERSE SHOULDER ARTHROPLASTY (Right) Patient reports pain as mild.  Pt has had increased back pain due to remaining in bed. Patient is well, and has had no acute complaints or problems Plan is to go Home after hospital stay. Negative for chest pain and shortness of breath Fever: no Gastrointestinal:Negative for nausea and vomiting  Objective: Vital signs in last 24 hours: Temp:  [96.7 F (35.9 C)-98.4 F (36.9 C)] 98.4 F (36.9 C) (05/17 0738) Pulse Rate:  [62-74] 74 (05/17 0430) Resp:  [9-18] 18 (05/17 0738) BP: (118-164)/(57-75) 150/68 mmHg (05/17 0738) SpO2:  [93 %-100 %] 95 % (05/17 0738) FiO2 (%):  [28 %] 28 % (05/16 1129)  Intake/Output from previous day:  Intake/Output Summary (Last 24 hours) at 04/07/16 0746 Last data filed at 04/07/16 0738  Gross per 24 hour  Intake   1410 ml  Output   2950 ml  Net  -1540 ml    Intake/Output this shift: Total I/O In: -  Out: 400 [Urine:400]  Labs:  Recent Labs  04/07/16 0434  HGB 13.5    Recent Labs  04/07/16 0434  WBC 7.8  RBC 4.35  HCT 38.1  PLT 412    Recent Labs  04/07/16 0434  NA 138  K 3.6  CL 102  CO2 29  BUN 6  CREATININE 0.39*  GLUCOSE 123*  CALCIUM 9.2   No results for input(s): LABPT, INR in the last 72 hours.   EXAM General - Patient is Alert, Appropriate and Oriented Extremity - ABD soft Sensation intact distally Incision: dressing C/D/I Dressing/Incision - clean, dry, no drainage Motor Function - intact, moving foot and toes well on exam.   Pt intact to light touch to the right arm in the axillary nerve distribution. Soft abdomen, with normal bowel sounds.  Past Medical History  Diagnosis Date  . Hypertension   . Arthritis   . GERD (gastroesophageal reflux disease)   . Colitis     Assessment/Plan: 1 Day Post-Op Procedure(s) (LRB): REVERSE SHOULDER ARTHROPLASTY (Right) Active Problems:   Status post reverse total shoulder  replacement   S/p reverse total shoulder arthroplasty  Estimated body mass index is 27.43 kg/(m^2) as calculated from the following:   Height as of this encounter: 5\' 2"  (1.575 m).   Weight as of this encounter: 68.04 kg (150 lb). Advance diet Up with therapy D/C IV fluids when tolerating po intake.  Up with PT today. Plan on discharge home today. Pt will need to void prior to discharge. ASA 81 twice daily for two weeks for DVT prophylaxis.  DVT Prophylaxis - Lovenox and TED hose Non-weight bearing to right upper extremity.  Raquel Fatoumata Albaugh, PA-C [facility] Orthopaedic Surgery 04/07/2016, 7:46 AM

## 2016-04-07 NOTE — Evaluation (Signed)
Physical Therapy Evaluation Patient Details Name: Melanie Salazar MRN: [id number] DOB: Sep 05, 1931 Today's Date: 04/07/2016   History of Present Illness  Patient is an 82 y/o female that presents for R reverse total shoulder replacement.  Clinical Impression  Patient presents s/p reverse total shoulder replacement. She reports pain as mild and is in tact with neurovascular assessment. She requires cuing for proper technique with scapular retractions, otherwise no deficits identified within her precautions. She is able to complete bed mobility, transfers, and ambulation with only concern being scissoring pattern she performs intermittently. PT encouraged her to eliminate this. No other deficits within her limitations noted, she would benefit from HHPT to improve her range of motion and mobility tolerance with RUE.     Follow Up Recommendations Home health PT    Equipment Recommendations       Recommendations for Other Services       Precautions / Restrictions Precautions Precautions: Shoulder Shoulder Interventions: Shoulder sling/immobilizer;At all times;Off for dressing/bathing/exercises Precaution Booklet Issued: Yes (comment) Required Braces or Orthoses: Sling Restrictions Weight Bearing Restrictions: Yes RUE Weight Bearing: Non weight bearing      Mobility  Bed Mobility Overal bed mobility: Modified Independent             General bed mobility comments: HOB elevated and patient used hand rail, otherwise no deficits.   Transfers Overall transfer level: Independent Equipment used: None             General transfer comment: No deficits identified.   Ambulation/Gait Ambulation/Gait assistance: Supervision Ambulation Distance (Feet): 200 Feet Assistive device: None Gait Pattern/deviations: WFL(Within Functional Limits)   Gait velocity interpretation: at or above normal speed for age/gender General Gait Details: Patient demonstrates normal gait mechanics,  though on 2-3 occasions scissored gait pattern with no loss of balance. PT educated patient to avoid scissoring for falls risk.   Stairs            Wheelchair Mobility    Modified Rankin (Stroke Patients Only)       Balance Overall balance assessment: Needs assistance Sitting-balance support: No upper extremity supported Sitting balance-Leahy Scale: Normal     Standing balance support: No upper extremity supported Standing balance-Leahy Scale: Good Standing balance comment: 2-3 occasions of scissoring, otherwise no balance deficits identified.                              Pertinent Vitals/Pain Pain Assessment:  (Patient reports pain as mild currently. )    Home Living Family/patient expects to be discharged to:: Private residence Living Arrangements: Spouse/significant other Available Help at Discharge: Family Type of Home: House Home Access: Stairs to enter Entrance Stairs-Rails: None Entrance Stairs-Number of Steps: 2 Home Layout: One level Home Equipment: Environmental consultant - 2 wheels;Cane - single point      Prior Function Level of Independence: Independent         Comments: No falls and no use of ADs reported.      Hand Dominance        Extremity/Trunk Assessment   Upper Extremity Assessment: RUE deficits/detail RUE Deficits / Details: Able to sqeeze and perform wrist extension/flexion AROM  RUE: Unable to fully assess due to immobilization       Lower Extremity Assessment: Overall WFL for tasks assessed         Communication   Communication: No difficulties  Cognition Arousal/Alertness: Awake/alert Behavior During Therapy: WFL for tasks assessed/performed Overall Cognitive Status: Within  Functional Limits for tasks assessed                      General Comments      Exercises Shoulder Exercises Wrist Flexion: AROM;Right;10 reps Wrist Extension: AROM;Right;10 reps Digit Composite Flexion: AROM;Right;10 reps Composite  Extension: AROM;Right;10 reps ROM for elbow, wrist and digits of operated UE: Set-up (Educated patient and husband on restrictions in place.) Sling wearing schedule (on at all times/off for ADL's): Set-up (Educated patient and husband on restrictions in place.) Positioning of UE while sleeping: Set-up (Educated patient and husband on restrictions in place.)      Assessment/Plan    PT Assessment Patient needs continued PT services  PT Diagnosis Other (comment) (Shoulder replacement)   PT Problem List Decreased range of motion;Pain;Decreased knowledge of precautions  PT Treatment Interventions DME instruction;Gait training;Stair training;Therapeutic activities;Therapeutic exercise;Balance training;Patient/family education   PT Goals (Current goals can be found in the Care Plan section) Acute Rehab PT Goals Patient Stated Goal: To return home  PT Goal Formulation: With patient/family Time For Goal Achievement: 04/21/16 Potential to Achieve Goals: Good    Frequency BID   Barriers to discharge        Co-evaluation               End of Session Equipment Utilized During Treatment: Gait belt Activity Tolerance: Patient tolerated treatment well Patient left: in bed;with call bell/phone within reach;with family/visitor present Nurse Communication: Mobility status         Time: [id number] PT Time Calculation (min) (ACUTE ONLY): 19 min   Charges:   PT Evaluation $PT Eval Moderate Complexity: 1 Procedure     PT G Codes:       Kerman Passey, PT, DPT    04/07/2016, 1:20 PM

## 2016-04-07 NOTE — Progress Notes (Signed)
DISCHARGE NOTE:  Pt given discharge instructions and prescriptions, pt verbalized understanding.

## 2016-04-07 NOTE — Progress Notes (Signed)
Clinical Social Worker (CSW) received SNF consult. PT is recommending home health. RN Case Manager is aware of above. Please reconsult if future social work needs arise. CSW signing off.   Lilliane Sposito Morgan, LCSW [phone] 

## 2016-04-07 NOTE — Discharge Summary (Signed)
Physician Discharge Summary  Patient ID: SCHAE DOUTHAT MRN: [id number] DOB/AGE: 07-03-1931 82 y.o.  Admit date: 04/06/2016 Discharge date: 04/07/2016  Admission Diagnoses:  complete tear of right rotator cuff,rotator cuff arthropathy  Massive irreparable rotator cuff tear with cuff arthropathy, right shoulder.  Discharge Diagnoses: Patient Active Problem List   Diagnosis Date Noted  . 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 S/p reverse total shoulder arthroplasty 04/06/2016  Massive irreparable rotator cuff tear with cuff arthropathy, right shoulder.   Past Medical History  Diagnosis Date  . Hypertension   . Arthritis   . GERD (gastroesophageal reflux disease)   . Colitis    Transfusion: None   Consultants (if any):    Discharged Condition: Improved  Hospital Course: Melanie Salazar is an 82 y.o. female who was admitted 04/06/2016 with a diagnosis of massive irreparable rotator cuff tear with cuff arthropathy of the right shoulder and went to the operating room on 04/06/2016 and underwent the above named procedures.    Surgeries: Procedure(s): REVERSE SHOULDER ARTHROPLASTY on 04/06/2016 Patient tolerated the surgery well. Taken to PACU where she was stabilized and then transferred to the orthopedic floor.  Started on Lovenox 40mg  q 24 hrs. Foot pumps applied bilaterally at 80 mm. Heels elevated on bed with rolled towels. No evidence of DVT. Negative Homan. Physical therapy started on day #1 for gait training and transfer. OT started day #1 for ADL and assisted devices.  Patient's IV , foley were removed on POD1.  Implants: All press-fit [employer] Comprehensive system with a #10 mini-humeral stem, a 44 mm humeral tray with a standard insert, and a mini-base plate with a 36 mm glenosphere.  She was given perioperative antibiotics:  Anti-infectives    Start     Dose/Rate Route Frequency Ordered Stop   04/06/16 1200  clindamycin (CLEOCIN) IVPB 600 mg      600 mg 100 mL/hr over 30 Minutes Intravenous Every 6 hours 04/06/16 1128 04/06/16 2329   04/06/16 0600  clindamycin (CLEOCIN) IVPB 900 mg     900 mg 100 mL/hr over 30 Minutes Intravenous  Once 04/06/16 0555 04/06/16 0824    . She was given sequential compression devices, early ambulation, and Lovenox for DVT prophylaxis.  She benefited maximally from the hospital stay and there were no complications.    Recent vital signs:  Filed Vitals:   04/07/16 0430 04/07/16 0738  BP: 164/68 150/68  Pulse: 74   Temp: 98.2 F (36.8 C) 98.4 F (36.9 C)  Resp: 18 18    Recent laboratory studies:  Lab Results  Component Value Date   HGB 13.5 04/07/2016   HGB 14.2 03/25/2016   Lab Results  Component Value Date   WBC 7.8 04/07/2016   PLT 412 04/07/2016   Lab Results  Component Value Date   INR 0.93 03/25/2016   Lab Results  Component Value Date   NA 138 04/07/2016   K 3.6 04/07/2016   CL 102 04/07/2016   CO2 29 04/07/2016   BUN 6 04/07/2016   CREATININE 0.39* 04/07/2016   GLUCOSE 123* 04/07/2016    Discharge Medications:     Medication List    TAKE these medications        amLODipine 5 MG tablet  Commonly known as:  NORVASC  Take 5 mg by mouth daily.     aspirin 81 MG tablet  Take 81 mg by mouth daily.     atenolol 100 MG tablet  Commonly known as:  TENORMIN  Take 100 mg by mouth daily.     beta carotene w/minerals tablet  Take 1 tablet by mouth 3 (three) times a week.     calcium citrate 950 MG tablet  Commonly known as:  CALCITRATE - dosed in mg elemental calcium  Take 1 tablet by mouth 2 (two) times daily.     cycloSPORINE 0.05 % ophthalmic emulsion  Commonly known as:  RESTASIS  Place 1 drop into both eyes 2 (two) times daily.     fluticasone 50 MCG/ACT nasal spray  Commonly known as:  FLONASE  Place 2 sprays into both nostrils daily.     loratadine 10 MG tablet  Commonly known as:  CLARITIN  Take 10 mg by mouth 3 (three) times a week.      omeprazole 20 MG capsule  Commonly known as:  PRILOSEC  Take 20 mg by mouth 3 (three) times a week. Mon, Wed, Fri     oxyCODONE 5 MG immediate release tablet  Commonly known as:  Oxy IR/ROXICODONE  Take 1-2 tablets (5-10 mg total) by mouth every 4 (four) hours as needed for breakthrough pain.     pediatric multivitamin-fluoride 0.25 MG chewable tablet  Chew 1 tablet by mouth daily.     pravastatin 40 MG tablet  Commonly known as:  PRAVACHOL  Take 40 mg by mouth at bedtime.     valsartan-hydrochlorothiazide 80-12.5 MG tablet  Commonly known as:  DIOVAN-HCT  Take 1 tablet by mouth daily.     zolpidem 5 MG tablet  Commonly known as:  AMBIEN  Take 5 mg by mouth at bedtime as needed for sleep.        Diagnostic Studies: Dg Chest 2 View  03/25/2016  CLINICAL DATA:  Preop evaluation for upcoming rotator cuff surgery EXAM: CHEST  2 VIEW COMPARISON:  None. FINDINGS: The heart size and mediastinal contours are within normal limits. Both lungs are clear. The visualized skeletal structures shows a chronic appearing compression fracture in the upper thoracic spine. Small hiatal hernia is noted. IMPRESSION: No active cardiopulmonary disease. Electronically Signed   By: Inez Catalina M.D.   On: 03/25/2016 14:46   Dg Shoulder Right Port  04/06/2016  CLINICAL DATA:  Status post total shoulder replacement EXAM: PORTABLE RIGHT SHOULDER - 2+ VIEW COMPARISON:  None. FINDINGS: Attempted frontal and Y scapular images obtained. There is a total shoulder replacement on the right with prosthetic components appearing well-seated. No fracture or dislocation. Visualized right lung clear. IMPRESSION: Prosthetic components appear well seated. No acute fracture or dislocation. Electronically Signed   By: Lowella Grip III M.D.   On: 04/06/2016 12:16   Disposition: Up with therapy today.  Will discharge home following PT session and patient voiding.  Continue ASA for DVT prophylaxis.  Follow-up with [facility]  in 10-14 days.   Signed: Judson Roch 04/07/2016, 7:51 AM

## 2016-04-07 NOTE — Discharge Instructions (Signed)
Shoulder Joint Replacement, Care After Refer to this sheet in the next few weeks. These instructions provide you with information on caring for yourself after your procedure. Your health care provider may also give you more specific instructions. Your treatment has been planned according to current medical practices, but problems sometimes occur. Call your health care provider if you have any problems or questions after your procedure. WHAT TO EXPECT AFTER THE PROCEDURE After your procedure, your arm and shoulder will typically be stiff and bruised. This will improve over time. HOME CARE INSTRUCTIONS   You may resume your normal diet and activities as directed by your surgeon.  You should regain full use of your shoulder in 6 weeks.  Your arm will be in a sling. You will need to wear this for 4-6 weeks after surgery.  Wear the sling every night for at least the first month, or as instructed by your surgeon.  Do not use your arm to push yourself up in bed or from a chair. This requires too much muscle.  Follow the program of home exercises suggested. Do the exercises 4-5 times a day for a month or as directed.  Try not to overuse your shoulder. Overusing the shoulder is easy to do if this is the first time you have been pain free in a long time. Early overuse of the shoulder may result in later problems.  Do not lift anything heavier than a cup of coffee for the first 6 weeks after surgery.  Ask for help. Your health care provider may be able to suggest a clinic or agency for this if you do not have home support.  Do not participate in contact sports or do any heavy lifting (more than 10 lb [4.5 kg]) for at least 6 months, or as directed.  Apply ice to the injured area for the first 2 days after surgery:  Put ice in a plastic bag.  Place a towel between your skin and the bag.  Leave the ice on for 20 minutes, 2-3 times a day.  Change dressings if necessary or as directed.  Only  take over-the-counter or prescription medicines for pain, discomfort, or fever as directed by your health care provider.  Keep all follow-up appointments as directed. SEEK MEDICAL CARE IF:  You have redness, swelling, or increasing pain in the wound.  You see pus coming from the wound.  You have a fever.  You notice a bad smell coming from the wound or dressing.  The edges of the wound break open after sutures or staples have been removed.  You have increasing pain with movement of the shoulder. SEEK IMMEDIATE MEDICAL CARE IF:   You develop a rash.  You have chest pain or shortness of breath.  You have any reaction or side effects to medicine given. MAKE SURE YOU:  Understand these instructions.  Will watch your condition.  Will get help right away if you are not doing well or get worse.   This information is not intended to replace advice given to you by your health care provider. Make sure you discuss any questions you have with your health care provider.  ASA 81mg  twice daily for two weeks for DVT prophylaxis.   Document Released: 05/28/2005 Document Revised: 11/13/2013 Document Reviewed: 06/07/2013 Elsevier Interactive Patient Education [employer].

## 2016-04-07 NOTE — Care Management (Signed)
Notified by Arville Go that patient was calling asking for home health. Patient discharged before I was able to follow up with her- SNF was pending. Arville Go will follow up with patient for HHPT. No further RNCM needs. Case closed.

## 2016-04-08 LAB — SURGICAL PATHOLOGY

## 2016-04-27 ENCOUNTER — Ambulatory Visit: Payer: Medicare Other | Attending: Surgery | Admitting: Physical Therapy

## 2016-04-27 DIAGNOSIS — M6281 Muscle weakness (generalized): Secondary | ICD-10-CM

## 2016-04-27 DIAGNOSIS — M25611 Stiffness of right shoulder, not elsewhere classified: Secondary | ICD-10-CM | POA: Diagnosis present

## 2016-04-27 DIAGNOSIS — M25511 Pain in right shoulder: Secondary | ICD-10-CM | POA: Diagnosis present

## 2016-04-28 NOTE — Therapy (Signed)
[facility] [address] [place], [place], [address] Phone: [phone]   Fax:  [phone]  Physical Therapy Evaluation  Patient Details  Name: Melanie Salazar MRN: [id number] Date of Birth: March 09, 1931 Referring Provider: Dr. Roland Rack  Encounter Date: 04/27/2016      PT End of Session - 04/28/16 2156    Visit Number 1   Number of Visits 8   Date for PT Re-Evaluation 05/25/16   Authorization - Visit Number 1   Authorization - Number of Visits 10   PT Start Time 1413   PT Stop Time 1511   PT Time Calculation (min) 58 min   Activity Tolerance Patient tolerated treatment well;Patient limited by pain   Behavior During Therapy [facility] for tasks assessed/performed      Past Medical History  Diagnosis Date  . Hypertension   . Arthritis   . GERD (gastroesophageal reflux disease)   . Colitis     Past Surgical History  Procedure Laterality Date  . Shoulder arthroscopy w/ rotator Salazar repair Left   . Wrist fracture surgery Left   . Colonoscopy N/A 03/27/2015    Procedure: COLONOSCOPY;  Surgeon: Hulen Luster, MD;  Location: [facility] ENDOSCOPY;  Service: Gastroenterology;  Laterality: N/A;  . Abdominal hysterectomy    . Fracture surgery    . Eye surgery Bilateral     Cataract Extraction with IOL  . Reverse shoulder arthroplasty Right 04/06/2016    Procedure: REVERSE SHOULDER ARTHROPLASTY;  Surgeon: Corky Mull, MD;  Location: [facility] ORS;  Service: Orthopedics;  Laterality: Right;    There were no vitals filed for this visit.       Subjective Assessment - 04/27/16 1431    Subjective Pt s/p R reverse total shoulder replacement on 04/06/16.  Pt. has a RTC complete tear that was unable to be repaired.  Reports that she has been using her left hand for ADLs/IADLs.   Patient Stated Goals Increase R shoulder AROM/ strength/ decrease pain.    Currently in Pain? Yes   Pain Score 3    Pain Location Shoulder   Pain Orientation Right   Pain Type  Surgical pain      Manual tx.:  Supine R shoulder PROM all planes (no ER >0 deg.) 10x each.  Ice to R shoulder in sitting after tx.           PT Education - 04/28/16 2156    Education provided Yes   Education Details Reviewed HEP   Person(s) Educated Patient   Methods Explanation;Demonstration   Comprehension Verbalized understanding;Returned demonstration             PT Long Term Goals - 04/28/16 2205    PT LONG TERM GOAL #1   Title Pt. I with HEP to increase R shoulder flexion/ abd. AROM to >100 deg. to improve washing hair/ADL.     Baseline R shoulder PROM 90 deg./ abd. 76 deg.     Time 4   Period Weeks   Status New   PT LONG TERM GOAL #2   Title Pt. will decrease QuickDASH to <40% to improve functional mobility.     Baseline QuickDASH: 63.6% on 04/27/16   Time 4   Period Weeks   Status New   PT LONG TERM GOAL #3   Title Pt. able to bathe/dress with no R shoulder pain to improve independence/pain-free mobility.    Baseline unable to assist with R shoulder at this time (PROM).  Time 4   Period Weeks   Status New   PT LONG TERM GOAL #4   Title Pt. will increase shoulder flexion/ abd. strength to grossly 3/5 MMT to improve lifting/ carry tasks.    Baseline unable to strength test at time of evaluation. 4   Time 4   Period Weeks   Status New               Plan - 04/28/16 02/24/2156    Clinical Impression Statement Pt. is a pleasant 82 y/o female s/p R reverse total shoulder replacement on 04/06/16.  Pt. arrived to PT with use of shoulder sling and understanding of precautions/ limitations.  Pt. reports no pain at rest, 3/10 R shoulder pain at worst/ bed time.  Pt. is currently not taking pain medication and using ice on PRN basis.  Pt. presents with decrease R shoulder PROM: flexion 90 deg., abd. 76 deg., ER 0 deg. (per MD orders), IR (WNL).  Pt. has good R incision healing with 1 steristrip in place and minimal scabbing present.  R elbow/wrist AROM WNL.   QuickDASH: 63.6%.  Pt. will benefit from skilled PT services to increase R shoulder ROM/ strengthening to improve return to pain-free functional mobility.     Rehab Potential Good   PT Frequency 2x / week   PT Duration 4 weeks   PT Treatment/Interventions ADLs/Self Care Home Management;Cryotherapy;Electrical Stimulation;Therapeutic exercise;Therapeutic activities;Functional mobility training;Neuromuscular re-education;Patient/family education;Manual techniques;Passive range of motion   PT Next Visit Plan Increase R shoulder PROM/ issue pulleys for HEP.  Discuss scar massage.    Consulted and Agree with Plan of Care Patient      Patient will benefit from skilled therapeutic intervention in order to improve the following deficits and impairments:  Hypomobility, Decreased scar mobility, Pain, Decreased strength, Decreased range of motion, Impaired flexibility, Postural dysfunction, Improper body mechanics  Visit Diagnosis: Shoulder joint stiffness, right  Pain in right shoulder  Muscle weakness (generalized)      G-Codes - 30-Apr-2016 02/24/2203    Functional Assessment Tool Used QuickDASH/ clinical impression/ pain/ joint stiffness/ muscle weakness   Functional Limitation Carrying, moving and handling objects   Carrying, Moving and Handling Objects Current Status [phone]) At least 60 percent but less than 80 percent impaired, limited or restricted   Carrying, Moving and Handling Objects Goal Status [id number]) At least 20 percent but less than 40 percent impaired, limited or restricted       Problem List Patient Active Problem List   Diagnosis Date Noted  . 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 S/p reverse total shoulder arthroplasty 04/06/2016   Pura Spice, PT, DPT # [phone]   04/28/2016, 10:10 PM  [facility] [address] [place], [place], [address] Phone: [phone]   Fax:  [phone]  Name: Melanie Salazar MRN: [id number] Date of Birth: Jan 13, 1931

## 2016-04-29 ENCOUNTER — Ambulatory Visit: Payer: Medicare Other | Admitting: Physical Therapy

## 2016-04-29 DIAGNOSIS — M6281 Muscle weakness (generalized): Secondary | ICD-10-CM

## 2016-04-29 DIAGNOSIS — M25511 Pain in right shoulder: Secondary | ICD-10-CM

## 2016-04-29 DIAGNOSIS — M25611 Stiffness of right shoulder, not elsewhere classified: Secondary | ICD-10-CM | POA: Diagnosis not present

## 2016-04-30 NOTE — Therapy (Signed)
[facility] [address] [place], [place], [address] Phone: [phone]   Fax:  [phone]  Physical Therapy Treatment  Patient Details  Name: Melanie Salazar MRN: [id number] Date of Birth: 01/26/1931 Referring Provider: Dr. Roland Rack  Encounter Date: 04/29/2016      PT End of Session - 04/30/16 1905    Visit Number 2   Number of Visits 8   Date for PT Re-Evaluation 05/25/16   Authorization - Visit Number 2   Authorization - Number of Visits 10   PT Start Time 1108   PT Stop Time 1158   PT Time Calculation (min) 50 min   Activity Tolerance Patient tolerated treatment well;Patient limited by pain   Behavior During Therapy [facility] for tasks assessed/performed      Past Medical History  Diagnosis Date  . Hypertension   . Arthritis   . GERD (gastroesophageal reflux disease)   . Colitis     Past Surgical History  Procedure Laterality Date  . Shoulder arthroscopy w/ rotator cuff repair Left   . Wrist fracture surgery Left   . Colonoscopy N/A 03/27/2015    Procedure: COLONOSCOPY;  Surgeon: Hulen Luster, MD;  Location: [facility] ENDOSCOPY;  Service: Gastroenterology;  Laterality: N/A;  . Abdominal hysterectomy    . Fracture surgery    . Eye surgery Bilateral     Cataract Extraction with IOL  . Reverse shoulder arthroplasty Right 04/06/2016    Procedure: REVERSE SHOULDER ARTHROPLASTY;  Surgeon: Corky Mull, MD;  Location: [facility] ORS;  Service: Orthopedics;  Laterality: Right;    There were no vitals filed for this visit.      Subjective Assessment - 04/30/16 1902    Subjective Pt. staets she is doing well.  3/10 R shoulder pain at rest in sling.     Limitations House hold activities;Writing;Lifting   Patient Stated Goals Increase R shoulder AROM/ strength/ decrease pain.    Currently in Pain? Yes   Pain Score 3    Pain Location Shoulder       OBJECTIVE:  Manual tx.:  Supine R shoulder PROM all planes (per MD protocol).  Scar  massage (cross friction)- no scabs or steristrips present.  There.ex.: reviewed HEP/ issued pulleys for HEP (PROM flexion/ abd.)- 10x 2 each (no pain).      Pt response for medical necessity: benefits from PROM at this time progressing to AROM to increase shoulder mobility.  No increase c/o pain reported during PROM per MD protocol.  Good scar healing.         PT Long Term Goals - 04/28/16 2205    PT LONG TERM GOAL #1   Title Pt. I with HEP to increase R shoulder flexion/ abd. AROM to >100 deg. to improve washing hair/ADL.     Baseline R shoulder PROM 90 deg./ abd. 76 deg.     Time 4   Period Weeks   Status New   PT LONG TERM GOAL #2   Title Pt. will decrease QuickDASH to <40% to improve functional mobility.     Baseline QuickDASH: 63.6% on 04/27/16   Time 4   Period Weeks   Status New   PT LONG TERM GOAL #3   Title Pt. able to bathe/dress with no R shoulder pain to improve independence/pain-free mobility.    Baseline unable to assist with R shoulder at this time (PROM).     Time 4   Period Weeks   Status New  PT LONG TERM GOAL #4   Title Pt. will increase shoulder flexion/ abd. strength to grossly 3/5 MMT to improve lifting/ carry tasks.    Baseline unable to strength test at time of evaluation. 4   Time 4   Period Weeks   Status New            Plan - 04/30/16 1907    Clinical Impression Statement R shoulder PROM all planes in a pain-tolerable range (no ER >0 deg.).  Good healing of R shoulder incision (no steristrips in place).  Pt. instruced in scar massage to prevent adhesions.     Rehab Potential Good   PT Frequency 2x / week   PT Duration 4 weeks   PT Treatment/Interventions ADLs/Self Care Home Management;Cryotherapy;Electrical Stimulation;Therapeutic exercise;Therapeutic activities;Functional mobility training;Neuromuscular re-education;Patient/family education;Manual techniques;Passive range of motion   PT Next Visit Plan Increase R shoulder PROM/reassess scar  healing.       Patient will benefit from skilled therapeutic intervention in order to improve the following deficits and impairments:  Hypomobility, Decreased scar mobility, Pain, Decreased strength, Decreased range of motion, Impaired flexibility, Postural dysfunction, Improper body mechanics  Visit Diagnosis: Shoulder joint stiffness, right  Pain in right shoulder  Muscle weakness (generalized)     Problem List Patient Active Problem List   Diagnosis Date Noted  . 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 S/p reverse total shoulder arthroplasty 04/06/2016   Pura Spice, PT, DPT # [phone]   04/30/2016, 7:13 PM  [place] [facility] [address] [place], [place], [address] Phone: [phone]   Fax:  [phone]  Name: Melanie Salazar MRN: [id number] Date of Birth: 1931-11-22

## 2016-05-04 ENCOUNTER — Encounter: Payer: Self-pay | Admitting: Physical Therapy

## 2016-05-04 ENCOUNTER — Ambulatory Visit: Payer: Medicare Other | Admitting: Physical Therapy

## 2016-05-04 DIAGNOSIS — M25611 Stiffness of right shoulder, not elsewhere classified: Secondary | ICD-10-CM | POA: Diagnosis not present

## 2016-05-04 DIAGNOSIS — M6281 Muscle weakness (generalized): Secondary | ICD-10-CM

## 2016-05-04 DIAGNOSIS — M25511 Pain in right shoulder: Secondary | ICD-10-CM

## 2016-05-04 NOTE — Therapy (Signed)
[facility] [address] [place], [place], [address] Phone: [phone]   Fax:  [phone]  Physical Therapy Treatment  Patient Details  Name: Melanie Salazar MRN: [id number] Date of Birth: 08/26/1931 Referring Provider: Dr. Roland Rack  Encounter Date: 05/04/2016      PT End of Session - 05/04/16 1223    Visit Number 3   Number of Visits 8   Date for PT Re-Evaluation 05/25/16   Authorization - Visit Number 3   Authorization - Number of Visits 10   PT Start Time [id number]   PT Stop Time 1159   PT Time Calculation (min) 48 min   Activity Tolerance Patient tolerated treatment well;Patient limited by pain   Behavior During Therapy [facility] for tasks assessed/performed      Past Medical History  Diagnosis Date  . Hypertension   . Arthritis   . GERD (gastroesophageal reflux disease)   . Colitis     Past Surgical History  Procedure Laterality Date  . Shoulder arthroscopy w/ rotator cuff repair Left   . Wrist fracture surgery Left   . Colonoscopy N/A 03/27/2015    Procedure: COLONOSCOPY;  Surgeon: Hulen Luster, MD;  Location: [facility] ENDOSCOPY;  Service: Gastroenterology;  Laterality: N/A;  . Abdominal hysterectomy    . Fracture surgery    . Eye surgery Bilateral     Cataract Extraction with IOL  . Reverse shoulder arthroplasty Right 04/06/2016    Procedure: REVERSE SHOULDER ARTHROPLASTY;  Surgeon: Corky Mull, MD;  Location: [facility] ORS;  Service: Orthopedics;  Laterality: Right;    There were no vitals filed for this visit.      Subjective Assessment - 05/04/16 1215    Subjective Pt. reports her shoulder is doing okay but she has a stomach ache.  Pt. compliant with use of sh. pulleys/ HEP.     Limitations House hold activities;Writing;Lifting   Patient Stated Goals Increase R shoulder AROM/ strength/ decrease pain.    Currently in Pain? Yes   Pain Score 3    Pain Location Shoulder   Pain Orientation Right   Pain Type Surgical pain       OBJECTIVE: There.ex.:  Seated B UBE 1 min. F/b/f/b AAROM (warm up/ no charge)- no increase c/o pain.  Reviewed HEP/ shoulder pulley and wand ex.   Manual tx.: Supine R shoulder PROM all planes (per MD protocol). Supine R shoulder AAROM: press-ups/ flexion/ abd. 20x each in pain tolerable range.      Pt response for medical necessity: benefits from PROM at this time progressing to AROM to increase shoulder mobility. No increase c/o pain reported during PROM per MD protocol. Good scar healing      PT Education - 05/04/16 1214    Education provided Yes   Education Details Supine AAROM (press-ups/ shoulder flexion).     Person(s) Educated Patient   Methods Explanation;Demonstration;Handout   Comprehension Verbalized understanding;Returned demonstration             PT Long Term Goals - 04/28/16 2205    PT LONG TERM GOAL #1   Title Pt. I with HEP to increase R shoulder flexion/ abd. AROM to >100 deg. to improve washing hair/ADL.     Baseline R shoulder PROM 90 deg./ abd. 76 deg.     Time 4   Period Weeks   Status New   PT LONG TERM GOAL #2   Title Pt. will decrease QuickDASH to <40% to improve functional mobility.  Baseline QuickDASH: 63.6% on 04/27/16   Time 4   Period Weeks   Status New   PT LONG TERM GOAL #3   Title Pt. able to bathe/dress with no R shoulder pain to improve independence/pain-free mobility.    Baseline unable to assist with R shoulder at this time (PROM).     Time 4   Period Weeks   Status New   PT LONG TERM GOAL #4   Title Pt. will increase shoulder flexion/ abd. strength to grossly 3/5 MMT to improve lifting/ carry tasks.    Baseline unable to strength test at time of evaluation. 4   Time 4   Period Weeks   Status New            Plan - 05/04/16 1224    Clinical Impression Statement Pt. has progressed to more AAROM in seated position with pulley and supine position with wand.  Pt. requires minimal cuing for proper technique  with AAROM and prevention of ER per MD protocol.  Pt. has good incision healing and compliant with scar massage/ healing process.     Rehab Potential Good   PT Frequency 2x / week   PT Duration 4 weeks   PT Treatment/Interventions ADLs/Self Care Home Management;Cryotherapy;Electrical Stimulation;Therapeutic exercise;Therapeutic activities;Functional mobility training;Neuromuscular re-education;Patient/family education;Manual techniques;Passive range of motion   PT Next Visit Plan Increase R shoulder PROM/reassess scar healing.    PT Home Exercise Plan pulley/ wand AAROM ex.     Consulted and Agree with Plan of Care Patient      Patient will benefit from skilled therapeutic intervention in order to improve the following deficits and impairments:  Hypomobility, Decreased scar mobility, Pain, Decreased strength, Decreased range of motion, Impaired flexibility, Postural dysfunction, Improper body mechanics  Visit Diagnosis: Shoulder joint stiffness, right  Pain in right shoulder  Muscle weakness (generalized)     Problem List Patient Active Problem List   Diagnosis Date Noted  . 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 S/p reverse total shoulder arthroplasty 04/06/2016   Pura Spice, PT, DPT # [phone]   05/04/2016, 12:28 PM  [place] [facility] [address] [place], [place], [address] Phone: [phone]   Fax:  [phone]  Name: Melanie Salazar MRN: [id number] Date of Birth: 1931/11/12

## 2016-05-06 ENCOUNTER — Encounter: Payer: Self-pay | Admitting: Physical Therapy

## 2016-05-06 ENCOUNTER — Ambulatory Visit: Payer: Medicare Other

## 2016-05-06 DIAGNOSIS — M25611 Stiffness of right shoulder, not elsewhere classified: Secondary | ICD-10-CM

## 2016-05-06 DIAGNOSIS — M6281 Muscle weakness (generalized): Secondary | ICD-10-CM

## 2016-05-06 DIAGNOSIS — M25511 Pain in right shoulder: Secondary | ICD-10-CM

## 2016-05-06 NOTE — Therapy (Signed)
[facility] [address] [place], [place], [address] Phone: [phone]   Fax:  [phone]  Physical Therapy Treatment  Patient Details  Name: Melanie Salazar MRN: [id number] Date of Birth: 1931/06/05 Referring Provider: Dr. Roland Rack  Encounter Date: 05/06/2016      PT End of Session - 05/06/16 1214    Visit Number 4   Number of Visits 8   Date for PT Re-Evaluation 05/25/16   Authorization - Visit Number 4   Authorization - Number of Visits 10   PT Start Time 1110   PT Stop Time 1150   PT Time Calculation (min) 40 min   Activity Tolerance Patient tolerated treatment well;Patient limited by pain   Behavior During Therapy [facility] for tasks assessed/performed      Past Medical History  Diagnosis Date  . Hypertension   . Arthritis   . GERD (gastroesophageal reflux disease)   . Colitis     Past Surgical History  Procedure Laterality Date  . Shoulder arthroscopy w/ rotator cuff repair Left   . Wrist fracture surgery Left   . Colonoscopy N/A 03/27/2015    Procedure: COLONOSCOPY;  Surgeon: Hulen Luster, MD;  Location: [facility] ENDOSCOPY;  Service: Gastroenterology;  Laterality: N/A;  . Abdominal hysterectomy    . Fracture surgery    . Eye surgery Bilateral     Cataract Extraction with IOL  . Reverse shoulder arthroplasty Right 04/06/2016    Procedure: REVERSE SHOULDER ARTHROPLASTY;  Surgeon: Corky Mull, MD;  Location: [facility] ORS;  Service: Orthopedics;  Laterality: Right;    There were no vitals filed for this visit.      Subjective Assessment - 05/06/16 1111    Subjective Pt reports she has a little bit of shoulder pain esp with AAROM flexion. Pain settles when she stops the movement pattern. She is conscious of pain and stops when it hurts.    Limitations House hold activities;Writing;Lifting   Patient Stated Goals Increase R shoulder AROM/ strength/ decrease pain.    Currently in Pain? No/denies      OBJECTIVE: Manual tx.:  Supine R shoulder PROM all planes (per MD protocol). Supine R shoulder AAROM: press-ups/ flexion/ abd. 20x each in pain tolerable range.Pt demonstrates increased in tolerable range with repeated reps of AAROM esp with shoulder flexion. AAROM pulleys in shoulder flx/abd 3 minutes each. Noted brief pain during shoulder abd AAROM but settles following neutral positioning of shoulder.    Pt response for medical necessity: benefits from [facility] at this time progressing to AROM to increase shoulder mobility. No increase c/o pain reported during PROM per MD protocol. Good scar healing       PT Long Term Goals - 04/28/16 2205    PT LONG TERM GOAL #1   Title Pt. I with HEP to increase R shoulder flexion/ abd. AROM to >100 deg. to improve washing hair/ADL.     Baseline R shoulder PROM 90 deg./ abd. 76 deg.     Time 4   Period Weeks   Status New   PT LONG TERM GOAL #2   Title Pt. will decrease QuickDASH to <40% to improve functional mobility.     Baseline QuickDASH: 63.6% on 04/27/16   Time 4   Period Weeks   Status New   PT LONG TERM GOAL #3   Title Pt. able to bathe/dress with no R shoulder pain to improve independence/pain-free mobility.    Baseline unable to assist with R shoulder at  this time (PROM).     Time 4   Period Weeks   Status New   PT LONG TERM GOAL #4   Title Pt. will increase shoulder flexion/ abd. strength to grossly 3/5 MMT to improve lifting/ carry tasks.    Baseline unable to strength test at time of evaluation. 4   Time 4   Period Weeks   Status New               Plan - 05/06/16 1215    Clinical Impression Statement Pt is compliant with HEP, able to perform pulleys and wand AAROM exercises at home with pain limited to end range. Following AAROM, pt demonstrates increase in range and ease of motion in shoulder flx, abd, and scaption.    Rehab Potential Good   PT Frequency 2x / week   PT Duration 4 weeks   PT Treatment/Interventions ADLs/Self  Care Home Management;Cryotherapy;Electrical Stimulation;Therapeutic exercise;Therapeutic activities;Functional mobility training;Neuromuscular re-education;Patient/family education;Manual techniques;Passive range of motion   PT Next Visit Plan Increase R shoulder PROM/reassess scar healing.    PT Home Exercise Plan pulley/ wand AAROM ex.     Consulted and Agree with Plan of Care Patient      Patient will benefit from skilled therapeutic intervention in order to improve the following deficits and impairments:  Hypomobility, Decreased scar mobility, Pain, Decreased strength, Decreased range of motion, Impaired flexibility, Postural dysfunction, Improper body mechanics  Visit Diagnosis: Shoulder joint stiffness, right  Pain in right shoulder  Muscle weakness (generalized)     Problem List Patient Active Problem List   Diagnosis Date Noted  . 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 S/p reverse total shoulder arthroplasty 04/06/2016   Mickel Baas Harshan Kearley SPT Huprich,Jason DPT 05/06/2016, 2:52 PM  [place] [facility] [address] [place], [place], [address] Phone: [phone]   Fax:  [phone]  Name: Melanie Salazar MRN: [id number] Date of Birth: [birth year]

## 2016-05-11 ENCOUNTER — Ambulatory Visit: Payer: Medicare Other | Admitting: Physical Therapy

## 2016-05-11 DIAGNOSIS — M6281 Muscle weakness (generalized): Secondary | ICD-10-CM

## 2016-05-11 DIAGNOSIS — M25511 Pain in right shoulder: Secondary | ICD-10-CM

## 2016-05-11 DIAGNOSIS — M25611 Stiffness of right shoulder, not elsewhere classified: Secondary | ICD-10-CM | POA: Diagnosis not present

## 2016-05-12 NOTE — Therapy (Signed)
[place] [facility] [address] [place], [place], [address] Phone: [phone]   Fax:  [phone]  Physical Therapy Treatment  Patient Details  Name: Melanie Salazar MRN: [id number] Date of Birth: 08/08/1931 Referring Provider: Dr. Roland Rack  Encounter Date: 05/11/2016      PT End of Session - 05/12/16 1757    Visit Number 5   Number of Visits 8   Date for PT Re-Evaluation 05/25/16   Authorization - Visit Number 5   Authorization - Number of Visits 10   PT Start Time 1112   PT Stop Time 1203   PT Time Calculation (min) 51 min   Activity Tolerance Patient tolerated treatment well;Patient limited by pain   Behavior During Therapy [facility] for tasks assessed/performed      Past Medical History  Diagnosis Date  . Hypertension   . Arthritis   . GERD (gastroesophageal reflux disease)   . Colitis     Past Surgical History  Procedure Laterality Date  . Shoulder arthroscopy w/ rotator cuff repair Left   . Wrist fracture surgery Left   . Colonoscopy N/A 03/27/2015    Procedure: COLONOSCOPY;  Surgeon: Hulen Luster, MD;  Location: [facility] ENDOSCOPY;  Service: Gastroenterology;  Laterality: N/A;  . Abdominal hysterectomy    . Fracture surgery    . Eye surgery Bilateral     Cataract Extraction with IOL  . Reverse shoulder arthroplasty Right 04/06/2016    Procedure: REVERSE SHOULDER ARTHROPLASTY;  Surgeon: Corky Mull, MD;  Location: [facility] ORS;  Service: Orthopedics;  Laterality: Right;    There were no vitals filed for this visit.      Subjective Assessment - 05/12/16 1753    Subjective Pt. states she is doing well today but was really sore/hurting this weekend, probably due to over doing the exercises/ repetitions.  Pt. has been compliant with ice.     Limitations House hold activities;Writing;Lifting   Patient Stated Goals Increase R shoulder AROM/ strength/ decrease pain.    Currently in Pain? Yes   Pain Score 2    Pain Location Shoulder   Pain Orientation Right   Pain Type Surgical pain     OBJECTIVE: Manual tx.: Supine R shoulder AA/PROM all planes (per MD protocol). STM to R deltoid/ biceps/ triceps 5 min.  There.ex.: reviewed wand AAROM HEP in supine position.  Discontinued wand abd. At this time due to discomfort.  Supine R shoulder serratus punches/ triceps/ bicep curls (AROM) 20x each.  Supine R yellow bulb squeezes (5#).     Pt response for medical necessity: benefits from [facility] at this time progressing to AROM to increase shoulder mobility. No increase c/o pain reported during AAROM per MD protocol. Increase pain with R shoulder abd. AAROM with wand and pt. Has difficulty with proper technique.          PT Long Term Goals - 04/28/16 2205    PT LONG TERM GOAL #1   Title Pt. I with HEP to increase R shoulder flexion/ abd. AROM to >100 deg. to improve washing hair/ADL.     Baseline R shoulder PROM 90 deg./ abd. 76 deg.     Time 4   Period Weeks   Status New   PT LONG TERM GOAL #2   Title Pt. will decrease QuickDASH to <40% to improve functional mobility.     Baseline QuickDASH: 63.6% on 04/27/16   Time 4   Period Weeks   Status New   PT  LONG TERM GOAL #3   Title Pt. able to bathe/dress with no R shoulder pain to improve independence/pain-free mobility.    Baseline unable to assist with R shoulder at this time (PROM).     Time 4   Period Weeks   Status New   PT LONG TERM GOAL #4   Title Pt. will increase shoulder flexion/ abd. strength to grossly 3/5 MMT to improve lifting/ carry tasks.    Baseline unable to strength test at time of evaluation. 4   Time 4   Period Weeks   Status New            Plan - 05/12/16 1759    Clinical Impression Statement Progressing well with AAROM in supine position and encouraged to avoid shoulder abd. with wand but continue with pulleys at this time (pain tolerable).  Decrease R UT compensation noted during tx. session with AAROM/ wand ex.  Minimal  tenderness with palpation over R deltoid/ biceps region.     Rehab Potential Good   PT Frequency 2x / week   PT Duration 4 weeks   PT Treatment/Interventions ADLs/Self Care Home Management;Cryotherapy;Electrical Stimulation;Therapeutic exercise;Therapeutic activities;Functional mobility training;Neuromuscular re-education;Patient/family education;Manual techniques;Passive range of motion   PT Next Visit Plan follow MD protocol/ increase R shoulder ROM in pain tolerable range.  Recheck ER.    PT Home Exercise Plan pulley/ wand AAROM ex.     Consulted and Agree with Plan of Care Patient      Patient will benefit from skilled therapeutic intervention in order to improve the following deficits and impairments:  Hypomobility, Decreased scar mobility, Pain, Decreased strength, Decreased range of motion, Impaired flexibility, Postural dysfunction, Improper body mechanics  Visit Diagnosis: Shoulder joint stiffness, right  Pain in right shoulder  Muscle weakness (generalized)     Problem List Patient Active Problem List   Diagnosis Date Noted  . 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 S/p reverse total shoulder arthroplasty 04/06/2016   Pura Spice, PT, DPT # [phone]   05/12/2016, 6:03 PM  [place] [facility] [address] [place], [place], [address] Phone: [phone]   Fax:  [phone]  Name: Melanie Salazar MRN: [id number] Date of Birth: [birth year]

## 2016-05-13 ENCOUNTER — Ambulatory Visit: Payer: Medicare Other | Admitting: Physical Therapy

## 2016-05-13 ENCOUNTER — Encounter: Payer: Self-pay | Admitting: Physical Therapy

## 2016-05-13 DIAGNOSIS — M25511 Pain in right shoulder: Secondary | ICD-10-CM

## 2016-05-13 DIAGNOSIS — M25611 Stiffness of right shoulder, not elsewhere classified: Secondary | ICD-10-CM | POA: Diagnosis not present

## 2016-05-13 DIAGNOSIS — M6281 Muscle weakness (generalized): Secondary | ICD-10-CM

## 2016-05-13 NOTE — Therapy (Signed)
[facility] [address] [place], [place], [address] Phone: [phone]   Fax:  [phone]  Physical Therapy Treatment  Patient Details  Name: Melanie Salazar MRN: [id number] Date of Birth: 1931-03-30 Referring Provider: Dr. Roland Rack  Encounter Date: 05/13/2016      PT End of Session - 05/13/16 1258    Visit Number 6   Number of Visits 8   Date for PT Re-Evaluation 05/25/16   Authorization - Visit Number 6   Authorization - Number of Visits 10   PT Start Time 1110   PT Stop Time 1155   PT Time Calculation (min) 45 min   Activity Tolerance Patient tolerated treatment well;Patient limited by pain   Behavior During Therapy [facility] for tasks assessed/performed      Past Medical History  Diagnosis Date  . Hypertension   . Arthritis   . GERD (gastroesophageal reflux disease)   . Colitis     Past Surgical History  Procedure Laterality Date  . Shoulder arthroscopy w/ rotator cuff repair Left   . Wrist fracture surgery Left   . Colonoscopy N/A 03/27/2015    Procedure: COLONOSCOPY;  Surgeon: Hulen Luster, MD;  Location: [facility] ENDOSCOPY;  Service: Gastroenterology;  Laterality: N/A;  . Abdominal hysterectomy    . Fracture surgery    . Eye surgery Bilateral     Cataract Extraction with IOL  . Reverse shoulder arthroplasty Right 04/06/2016    Procedure: REVERSE SHOULDER ARTHROPLASTY;  Surgeon: Corky Mull, MD;  Location: [facility] ORS;  Service: Orthopedics;  Laterality: Right;    There were no vitals filed for this visit.      Subjective Assessment - 05/13/16 1257    Subjective Pt states that her shoulder has recovered from feelings of pain over the weekend; notes that she does continue to feel some soreness in the shoulder but believes it is due to muscle fatigue. Pt states regular popping in her shoulder but is not painful.   Limitations House hold activities;Writing;Lifting   Patient Stated Goals Increase R shoulder AROM/ strength/  decrease pain.    Currently in Pain? No/denies      OBJECTIVE: Manual tx.: Supine R shoulder AA/PROM all planes (per MD protocol). There.ex.: reviewed wand AAROM HEP in supine position. Supine R shoulder serratus punches/scapular squeezes (AROM) 20x each. Shoulder ladder x5, Pulleys abd (3 minutes).   Pt response for medical necessity: benefits from [facility] at this time progressing to AROM to increase shoulder mobility. No increase c/o pain reported during AAROM per MD protocol. Increase pain with R shoulder abd. AAROM with wand and pt. Has difficulty with proper technique.        PT Long Term Goals - 04/28/16 2205    PT LONG TERM GOAL #1   Title Pt. I with HEP to increase R shoulder flexion/ abd. AROM to >100 deg. to improve washing hair/ADL.     Baseline R shoulder PROM 90 deg./ abd. 76 deg.     Time 4   Period Weeks   Status New   PT LONG TERM GOAL #2   Title Pt. will decrease QuickDASH to <40% to improve functional mobility.     Baseline QuickDASH: 63.6% on 04/27/16   Time 4   Period Weeks   Status New   PT LONG TERM GOAL #3   Title Pt. able to bathe/dress with no R shoulder pain to improve independence/pain-free mobility.    Baseline unable to assist with R shoulder at  this time (PROM).     Time 4   Period Weeks   Status New   PT LONG TERM GOAL #4   Title Pt. will increase shoulder flexion/ abd. strength to grossly 3/5 MMT to improve lifting/ carry tasks.    Baseline unable to strength test at time of evaluation. 4   Time 4   Period Weeks   Status New             Plan - 05/13/16 1301    Clinical Impression Statement Continues to progress with AAROM shoulder progression, able to tolerate therapist assisted AAROM in abd and with pulleys.  Improved arm swing during gait pattern with less UT compensatory movement patterns.     Rehab Potential Good   PT Frequency 2x / week   PT Duration 4 weeks   PT Treatment/Interventions ADLs/Self Care Home  Management;Cryotherapy;Electrical Stimulation;Therapeutic exercise;Therapeutic activities;Functional mobility training;Neuromuscular re-education;Patient/family education;Manual techniques;Passive range of motion   PT Next Visit Plan follow MD protocol/ increase R shoulder ROM in pain tolerable range.  Recheck ER. SEND MD PROGRESS NOTE.   PT Home Exercise Plan pulley/ wand AAROM ex.     Consulted and Agree with Plan of Care Patient      Patient will benefit from skilled therapeutic intervention in order to improve the following deficits and impairments:  Hypomobility, Decreased scar mobility, Pain, Decreased strength, Decreased range of motion, Impaired flexibility, Postural dysfunction, Improper body mechanics  Visit Diagnosis: Shoulder joint stiffness, right  Pain in right shoulder  Muscle weakness (generalized)     Problem List Patient Active Problem List   Diagnosis Date Noted  . 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 S/p reverse total shoulder arthroplasty 04/06/2016   Pura Spice, PT, DPT # [phone] Derrill Memo, SPT  05/13/2016, 2:05 PM  [facility] [address] [place], [place], [address] Phone: [phone]   Fax:  [phone]  Name: Melanie Salazar MRN: [id number] Date of Birth: 02-02-1931

## 2016-05-18 ENCOUNTER — Ambulatory Visit: Payer: Medicare Other | Admitting: Physical Therapy

## 2016-05-18 DIAGNOSIS — M25611 Stiffness of right shoulder, not elsewhere classified: Secondary | ICD-10-CM | POA: Diagnosis not present

## 2016-05-18 DIAGNOSIS — M6281 Muscle weakness (generalized): Secondary | ICD-10-CM

## 2016-05-18 DIAGNOSIS — M25511 Pain in right shoulder: Secondary | ICD-10-CM

## 2016-05-18 NOTE — Therapy (Signed)
[facility] [address] [place], [place], [address] Phone: [phone]   Fax:  [phone]  Physical Therapy Treatment  Patient Details  Name: Melanie Salazar MRN: [id number] Date of Birth: 09-16-1931 Referring Provider: Dr. Roland Rack  Encounter Date: 05/18/2016      PT End of Session - 05/18/16 1116    Visit Number 7   Number of Visits 15   Date for PT Re-Evaluation 06/15/16   Authorization - Visit Number 7   Authorization - Number of Visits 16   PT Start Time 1109   PT Stop Time 1149   PT Time Calculation (min) 40 min   Activity Tolerance Patient tolerated treatment well;Patient limited by pain   Behavior During Therapy [facility] for tasks assessed/performed      Past Medical History  Diagnosis Date  . Hypertension   . Arthritis   . GERD (gastroesophageal reflux disease)   . Colitis     Past Surgical History  Procedure Laterality Date  . Shoulder arthroscopy w/ rotator cuff repair Left   . Wrist fracture surgery Left   . Colonoscopy N/A 03/27/2015    Procedure: COLONOSCOPY;  Surgeon: Hulen Luster, MD;  Location: [facility] ENDOSCOPY;  Service: Gastroenterology;  Laterality: N/A;  . Abdominal hysterectomy    . Fracture surgery    . Eye surgery Bilateral     Cataract Extraction with IOL  . Reverse shoulder arthroplasty Right 04/06/2016    Procedure: REVERSE SHOULDER ARTHROPLASTY;  Surgeon: Corky Mull, MD;  Location: [facility] ORS;  Service: Orthopedics;  Laterality: Right;    There were no vitals filed for this visit.      Subjective Assessment - 05/18/16 1114    Subjective Pt. reports no R shoulder pain currently at rest, prior to tx. session.  Pt. states she does have increaes R shoulder discomfort with AAROM flexion/abd. Pt. is 6 weeks s/p surgery today.     Limitations House hold activities;Writing;Lifting   Patient Stated Goals Increase R shoulder AROM/ strength/ decrease pain.    Currently in Pain? No/denies       OBJECTIVE: Manual tx.: Supine R shoulder AA/PROM all planes (per MD protocol). There.ex.:Supine R shoulder serratus punches/scapular squeezes (AROM) 20x each.  Supine R shoulder flexion/ abd./ ER/ IR/ horizontal abd. AROM 10x. Shoulder ladder x 10 (flexion/ abd.).  Reviewed HEP.  PT hoping to progress to R shoulder isometrics next tx.   Pt response for medical necessity: benefits from [facility] at this time progressing to AROM to increase shoulder mobility. No increase c/o pain reported during A/AROM per MD protocol. Marked improvement in R shoulder AROM and daily mobility with R shoulder.        PT Long Term Goals - 05/18/16 1136    PT LONG TERM GOAL #1   Title Pt. I with HEP to increase R shoulder flexion/ abd. AROM to >100 deg. to improve washing hair/ADL.     Baseline Supine/seated R shoulder AROM: flexion (121/99 deg.), abd. (88/84 deg.), ER (32 deg.), IR (WFL).        Time 4   Period Weeks   Status Partially Met   PT LONG TERM GOAL #2   Title Pt. will decrease QuickDASH to <40% to improve functional mobility.     Baseline QuickDASH: 38.6% on 05/18/16   Time 4   Period Weeks   Status Achieved   PT LONG TERM GOAL #3   Title Pt. able to bathe/dress with no R shoulder pain to  improve independence/pain-free mobility.    Time 4   Period Weeks   Status Achieved   PT LONG TERM GOAL #4   Title Pt. will increase shoulder flexion/ abd. strength to grossly 3/5 MMT to improve lifting/ carry tasks.    Baseline unable to strength test at time of evaluation.    Time 4   Period Weeks   Status On-going   PT LONG TERM GOAL #5   Title Pt. will decrease QuickDASH to <30% to improve functional mobility.     Baseline QuickDASH: 38.6% on 06/13/16   Time 4   Period Weeks   Status New            Plan - 13-Jun-2016 1119    Clinical Impression Statement Supine/seated R shoulder AROM: flexion (121/99 deg.), abd. (88/84 deg.), ER (32 deg.), IR (WFL).  Standing R shoulder  flex./abd. during wall ladder AAROM is 134/120 deg.   Pt. has progressed well with R shoulder ROM/ functional mobility with minimal c/o pain.  Pt. will benefit from continued skilled PT services with focus on ROM/ strengthening ex. program to maximize functional mobility.     Rehab Potential Good   PT Frequency 2x / week   PT Duration 4 weeks   PT Treatment/Interventions ADLs/Self Care Home Management;Cryotherapy;Electrical Stimulation;Therapeutic exercise;Therapeutic activities;Functional mobility training;Neuromuscular re-education;Patient/family education;Manual techniques;Passive range of motion   PT Next Visit Plan follow MD protocol/ increase R shoulder ROM and start isometrics with MD goes well.     PT Home Exercise Plan pulley/ wand AAROM ex.     Consulted and Agree with Plan of Care Patient      Patient will benefit from skilled therapeutic intervention in order to improve the following deficits and impairments:  Hypomobility, Decreased scar mobility, Pain, Decreased strength, Decreased range of motion, Impaired flexibility, Postural dysfunction, Improper body mechanics  Visit Diagnosis: Shoulder joint stiffness, right  Pain in right shoulder  Muscle weakness (generalized)       G-Codes - 13-Jun-2016 1247    Functional Assessment Tool Used QuickDASH/ clinical impression/ pain/ joint stiffness/ muscle weakness   Functional Limitation Carrying, moving and handling objects   Carrying, Moving and Handling Objects Current Status [phone]) At least 40 percent but less than 60 percent impaired, limited or restricted   Carrying, Moving and Handling Objects Goal Status ([id number]) At least 20 percent but less than 40 percent impaired, limited or restricted      Problem List Patient Active Problem List   Diagnosis Date Noted  . 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 S/p reverse total shoulder arthroplasty 04/06/2016   Pura Spice, PT, DPT # [phone]   June 13, 2016,  12:54 PM  [facility] [address] [place], [place], [address] Phone: [phone]   Fax:  [phone]  Name: Melanie Salazar MRN: [id number] Date of Birth: May 02, 1931

## 2016-05-20 ENCOUNTER — Ambulatory Visit: Payer: Medicare Other | Admitting: Physical Therapy

## 2016-05-20 ENCOUNTER — Encounter: Payer: Self-pay | Admitting: Physical Therapy

## 2016-05-20 DIAGNOSIS — M25611 Stiffness of right shoulder, not elsewhere classified: Secondary | ICD-10-CM

## 2016-05-20 DIAGNOSIS — M25511 Pain in right shoulder: Secondary | ICD-10-CM

## 2016-05-20 DIAGNOSIS — M6281 Muscle weakness (generalized): Secondary | ICD-10-CM

## 2016-05-20 NOTE — Therapy (Signed)
[place] [facility] [address] [place], [place], [address] Phone: [phone]   Fax:  [phone]  Physical Therapy Treatment  Patient Details  Name: Melanie Salazar MRN: [id number] Date of Birth: October 04, 1931 Referring Provider: Dr. Roland Rack  Encounter Date: 05/20/2016      PT End of Session - 05/20/16 1259    Visit Number 8   Number of Visits 15   Date for PT Re-Evaluation 06/15/16   Authorization - Visit Number 8   Authorization - Number of Visits 16   PT Start Time 1112   PT Stop Time 1156   PT Time Calculation (min) 44 min   Activity Tolerance Patient tolerated treatment well   Behavior During Therapy [facility] for tasks assessed/performed      Past Medical History  Diagnosis Date  . Hypertension   . Arthritis   . GERD (gastroesophageal reflux disease)   . Colitis     Past Surgical History  Procedure Laterality Date  . Shoulder arthroscopy w/ rotator cuff repair Left   . Wrist fracture surgery Left   . Colonoscopy N/A 03/27/2015    Procedure: COLONOSCOPY;  Surgeon: Hulen Luster, MD;  Location: [facility] ENDOSCOPY;  Service: Gastroenterology;  Laterality: N/A;  . Abdominal hysterectomy    . Fracture surgery    . Eye surgery Bilateral     Cataract Extraction with IOL  . Reverse shoulder arthroplasty Right 04/06/2016    Procedure: REVERSE SHOULDER ARTHROPLASTY;  Surgeon: Corky Mull, MD;  Location: [facility] ORS;  Service: Orthopedics;  Laterality: Right;    There were no vitals filed for this visit.      Subjective Assessment - 05/20/16 1258    Subjective Pt reports she saw MD previous day; reports some mild discomfort following x-ray positioning but currently no c/o R shoulder pain.   Limitations House hold activities;Writing;Lifting   Patient Stated Goals Increase R shoulder AROM/ strength/ decrease pain.    Currently in Pain? No/denies      OBJECTIVE: UBE 5 min frwds/bckwards.Supine R shoulder flexion/ abd./ ER/ IR/ horizontal  abd. AROM 10x. Short lever resisted isometerics all planes with with 25 deg increments shoulder flx (4 bouts; 3 min ea.)Wall isometrics all planes (flx/abd/ER/IR); pt c/o pain with ext past neutral. Shoulder ladder x 10 (flexion/ abd.).Standing scapular retractions. Reviewed HEP. PT hoping to progress to R shoulder isometrics next tx.  SEE new HEP.    Pt response for medical necessity: benefits from [facility] at this time progressing to AROM to increase shoulder mobility. No increase c/o pain reported during A/AROM per MD protocol. Marked improvement in R shoulder AROM and daily mobility with R shoulder.           PT Long Term Goals - 05/18/16 1136    PT LONG TERM GOAL #1   Title Pt. I with HEP to increase R shoulder flexion/ abd. AROM to >100 deg. to improve washing hair/ADL.     Baseline Supine/seated R shoulder AROM: flexion (121/99 deg.), abd. (88/84 deg.), ER (32 deg.), IR (WFL).        Time 4   Period Weeks   Status Partially Met   PT LONG TERM GOAL #2   Title Pt. will decrease QuickDASH to <40% to improve functional mobility.     Baseline QuickDASH: 38.6% on 05/18/16   Time 4   Period Weeks   Status Achieved   PT LONG TERM GOAL #3   Title Pt. able to bathe/dress with no R shoulder  pain to improve independence/pain-free mobility.    Time 4   Period Weeks   Status Achieved   PT LONG TERM GOAL #4   Title Pt. will increase shoulder flexion/ abd. strength to grossly 3/5 MMT to improve lifting/ carry tasks.    Baseline unable to strength test at time of evaluation.    Time 4   Period Weeks   Status On-going   PT LONG TERM GOAL #5   Title Pt. will decrease QuickDASH to <30% to improve functional mobility.     Baseline QuickDASH: 38.6% on 05/18/16   Time 4   Period Weeks   Status New            Plan - 05/20/16 1300    Clinical Impression Statement Pt progress towards isometric strengthening program with no c/o increase pain with exercise intensity. Pt  with mildly limited shoulder flx, abd, er but continues to make incremental gains   Rehab Potential Good   PT Frequency 2x / week   PT Duration 4 weeks   PT Treatment/Interventions ADLs/Self Care Home Management;Cryotherapy;Electrical Stimulation;Therapeutic exercise;Therapeutic activities;Functional mobility training;Neuromuscular re-education;Patient/family education;Manual techniques;Passive range of motion   PT Next Visit Plan follow MD protocol/ increase R shoulder ROM and start isometrics with MD goes well.     PT Home Exercise Plan isometerics, scapular retractions with therband   Consulted and Agree with Plan of Care Patient      Patient will benefit from skilled therapeutic intervention in order to improve the following deficits and impairments:  Hypomobility, Decreased scar mobility, Pain, Decreased strength, Decreased range of motion, Impaired flexibility, Postural dysfunction, Improper body mechanics  Visit Diagnosis: Shoulder joint stiffness, right  Pain in right shoulder  Muscle weakness (generalized)     Problem List Patient Active Problem List   Diagnosis Date Noted  . 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 S/p reverse total shoulder arthroplasty 04/06/2016   Pura Spice, PT, DPT # [phone] Mickel Baas Finlay Godbee SPT 05/20/2016, 1:05 PM  [facility] [address] [place], [place], [address] Phone: [phone]   Fax:  [phone]  Name: Melanie Salazar MRN: [id number] Date of Birth: 11/19/1931

## 2016-05-24 ENCOUNTER — Encounter: Payer: Self-pay | Admitting: Physical Therapy

## 2016-05-24 ENCOUNTER — Ambulatory Visit: Payer: Medicare Other | Attending: Surgery | Admitting: Physical Therapy

## 2016-05-24 DIAGNOSIS — M25611 Stiffness of right shoulder, not elsewhere classified: Secondary | ICD-10-CM | POA: Insufficient documentation

## 2016-05-24 DIAGNOSIS — M25511 Pain in right shoulder: Secondary | ICD-10-CM | POA: Insufficient documentation

## 2016-05-24 DIAGNOSIS — M6281 Muscle weakness (generalized): Secondary | ICD-10-CM | POA: Diagnosis present

## 2016-05-24 NOTE — Therapy (Signed)
[facility] [address] [place], [place], [address] Phone: [phone]   Fax:  [phone]  Physical Therapy Treatment  Patient Details  Name: Melanie Salazar MRN: [id number] Date of Birth: 1931-09-07 Referring Provider: Dr. Roland Rack  Encounter Date: 05/24/2016      PT End of Session - 05/24/16 1251    Visit Number 9   Number of Visits 15   Date for PT Re-Evaluation 06/15/16   Authorization - Visit Number 9   Authorization - Number of Visits 16   PT Start Time 3536   PT Stop Time 1158   PT Time Calculation (min) 47 min   Activity Tolerance Patient tolerated treatment well   Behavior During Therapy [facility] for tasks assessed/performed      Past Medical History  Diagnosis Date  . Hypertension   . Arthritis   . GERD (gastroesophageal reflux disease)   . Colitis     Past Surgical History  Procedure Laterality Date  . Shoulder arthroscopy w/ rotator cuff repair Left   . Wrist fracture surgery Left   . Colonoscopy N/A 03/27/2015    Procedure: COLONOSCOPY;  Surgeon: Hulen Luster, MD;  Location: [facility] ENDOSCOPY;  Service: Gastroenterology;  Laterality: N/A;  . Abdominal hysterectomy    . Fracture surgery    . Eye surgery Bilateral     Cataract Extraction with IOL  . Reverse shoulder arthroplasty Right 04/06/2016    Procedure: REVERSE SHOULDER ARTHROPLASTY;  Surgeon: Corky Mull, MD;  Location: [facility] ORS;  Service: Orthopedics;  Laterality: Right;    There were no vitals filed for this visit.      Subjective Assessment - 05/24/16 1119    Subjective Pt reports that she has increased pain with isometeric HEP immediately following activity. She is unsure if she is pushing too hard into the wall. She reports no other changes in activity that could account for increase in pain.    Limitations House hold activities;Writing;Lifting   Patient Stated Goals Increase R shoulder AROM/ strength/ decrease pain.    Currently in Pain? No/denies      OBJECTIVE: UBE 5 min frwds/bckwards.Supine R shoulder flexion/ abd./ ER/ IR/ horizontal abd. AROM 10x. Short lever resisted isometerics all planes with 25 deg increments shoulder flx (4 bouts; 3 min ea.)Shoulder ladder x 10 (flexion/ abd.).Standing D1/D2 pattern in front of mirror for visual cue. Pt with tendency for shoulder shrug during flx/abd of R shoulder with patterns. Reviewed HEP: decreased intensity to every other day; discouraged shoulder ext; pt instructed to perform isometrics at 50% of max. Manual tx: STM of R bicep due to muscle hypertonicity and pt c/o soreness.  Pt response for medical necessity: benefits from AAROM/AROM at this time progressing toward strengthening program.Pt with mild increase in pain following increase in exercise intensity last session; HEP modified to allow for pain free strengthening progression.      PT Long Term Goals - 05/18/16 1136    PT LONG TERM GOAL #1   Title Pt. I with HEP to increase R shoulder flexion/ abd. AROM to >100 deg. to improve washing hair/ADL.     Baseline Supine/seated R shoulder AROM: flexion (121/99 deg.), abd. (88/84 deg.), ER (32 deg.), IR (WFL).        Time 4   Period Weeks   Status Partially Met   PT LONG TERM GOAL #2   Title Pt. will decrease QuickDASH to <40% to improve functional mobility.     Baseline QuickDASH: 38.6%  on 05/18/16   Time 4   Period Weeks   Status Achieved   PT LONG TERM GOAL #3   Title Pt. able to bathe/dress with no R shoulder pain to improve independence/pain-free mobility.    Time 4   Period Weeks   Status Achieved   PT LONG TERM GOAL #4   Title Pt. will increase shoulder flexion/ abd. strength to grossly 3/5 MMT to improve lifting/ carry tasks.    Baseline unable to strength test at time of evaluation.    Time 4   Period Weeks   Status On-going   PT LONG TERM GOAL #5   Title Pt. will decrease QuickDASH to <30% to improve functional mobility.     Baseline QuickDASH: 38.6% on  05/18/16   Time 4   Period Weeks   Status New               Plan - 05/24/16 1252    Clinical Impression Statement Pt able to tolerate isometric strengthening program at approx 50% of capacity with increase in pain when pt performing exercise at full strength. Pt displays mild limitations in overall shoulder ROM. Pt will benefit from decrease in intensity of HEP at this time to progress pain free.    Rehab Potential Good   PT Frequency 2x / week   PT Duration 4 weeks   PT Treatment/Interventions ADLs/Self Care Home Management;Cryotherapy;Electrical Stimulation;Therapeutic exercise;Therapeutic activities;Functional mobility training;Neuromuscular re-education;Patient/family education;Manual techniques;Passive range of motion   PT Next Visit Plan follow MD protocol/ increase R shoulder ROM and start isometrics with MD goes well.     PT Home Exercise Plan AROM, isometrics   Consulted and Agree with Plan of Care Patient      Patient will benefit from skilled therapeutic intervention in order to improve the following deficits and impairments:  Hypomobility, Decreased scar mobility, Pain, Decreased strength, Decreased range of motion, Impaired flexibility, Postural dysfunction, Improper body mechanics  Visit Diagnosis: Shoulder joint stiffness, right  Pain in right shoulder  Muscle weakness (generalized)     Problem List Patient Active Problem List   Diagnosis Date Noted  . 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 S/p reverse total shoulder arthroplasty 04/06/2016    Mickel Baas [place] SPT 05/24/2016, 12:56 PM  [facility] [address] [place], [place], [address] Phone: [phone]   Fax:  [phone]  Name: BLIA TOTMAN MRN: [id number] Date of Birth: Jan 15, 1931

## 2016-05-26 ENCOUNTER — Encounter: Payer: Self-pay | Admitting: Physical Therapy

## 2016-05-26 ENCOUNTER — Ambulatory Visit: Payer: Medicare Other

## 2016-05-26 DIAGNOSIS — M6281 Muscle weakness (generalized): Secondary | ICD-10-CM

## 2016-05-26 DIAGNOSIS — M25511 Pain in right shoulder: Secondary | ICD-10-CM

## 2016-05-26 DIAGNOSIS — M25611 Stiffness of right shoulder, not elsewhere classified: Secondary | ICD-10-CM

## 2016-05-26 NOTE — Therapy (Signed)
[place] [facility] [address] [place], [place], [address] Phone: [phone]   Fax:  [phone]  Physical Therapy Treatment  Patient Details  Name: Melanie Salazar MRN: [id number] Date of Birth: 27-Sep-1931 Referring Provider: Dr. Roland Rack  Encounter Date: 05/26/2016      PT End of Session - 05/26/16 1111    Visit Number 10   Number of Visits 15   Date for PT Re-Evaluation 06/15/16   Authorization - Visit Number 10   Authorization - Number of Visits 16   PT Start Time 8250   PT Stop Time 5397   PT Time Calculation (min) 43 min   Activity Tolerance Patient tolerated treatment well;No increased pain   Behavior During Therapy [facility] for tasks assessed/performed      Past Medical History  Diagnosis Date  . Hypertension   . Arthritis   . GERD (gastroesophageal reflux disease)   . Colitis     Past Surgical History  Procedure Laterality Date  . Shoulder arthroscopy w/ rotator cuff repair Left   . Wrist fracture surgery Left   . Colonoscopy N/A 03/27/2015    Procedure: COLONOSCOPY;  Surgeon: Hulen Luster, MD;  Location: [facility] ENDOSCOPY;  Service: Gastroenterology;  Laterality: N/A;  . Abdominal hysterectomy    . Fracture surgery    . Eye surgery Bilateral     Cataract Extraction with IOL  . Reverse shoulder arthroplasty Right 04/06/2016    Procedure: REVERSE SHOULDER ARTHROPLASTY;  Surgeon: Corky Mull, MD;  Location: [facility] ORS;  Service: Orthopedics;  Laterality: Right;    There were no vitals filed for this visit.      Subjective Assessment - 05/26/16 1158    Subjective Pt reports a mild increase in posterior/anterior proximal UE pain starting Monday night. She states she took a break from her HEP strengthening and has been icing and taking tylenol to reduce pain.   Limitations House hold activities;Writing;Lifting   Patient Stated Goals Increase R shoulder AROM/ strength/ decrease pain.    Pain Score 4    Pain Location Shoulder   Pain Orientation Right   Pain Type Surgical pain      OBJECTIVE: Manual tx: PROM R shoulder flx/abd/IR/ER (15 minutes) to promote pain free mobility and increase ROM. There ex: Supine AROM with PVC pipe press ups with shoulder flexion 3x10. Pt experiences mild pain with end range flexion on superior aspect of R shoulder. Supine R shoulder flexion/abd./ER/ IR/horizontal abd. AROM 10x. Short lever resisted isometerics all planes with with 25 deg increments shoulder flx (4 bouts; 3 min ea.) Shoulder ladder x 10 (flexion/ abd); pt able to achieve equal ROM flex/abd this date with only mild compensatory strategies in shoulder abduction.   Pt response for medical necessity: Pt with deficits in shoulder ROM and strength following reverse total shoulder. She displays mild compensatory patterns for R shoulder flexion/abduction but is able to limit with cueing. Following recent increase in exercise intensity, pt with mild increase in proximal UE pain. Able to tolerate today's session without increase in sx with no additional deficits. Will benefit from gradual strengthening program to promote pain free mobility.      PT Education - 05/26/16 1217    Education provided Yes   Education Details Progression of return to function following reverse total shoulder   Person(s) Educated Patient   Methods Explanation   Comprehension Verbalized understanding             PT Long Term Goals -  05/18/16 1136    PT LONG TERM GOAL #1   Title Pt. I with HEP to increase R shoulder flexion/ abd. AROM to >100 deg. to improve washing hair/ADL.     Baseline Supine/seated R shoulder AROM: flexion (121/99 deg.), abd. (88/84 deg.), ER (32 deg.), IR (WFL).        Time 4   Period Weeks   Status Partially Met   PT LONG TERM GOAL #2   Title Pt. will decrease QuickDASH to <40% to improve functional mobility.     Baseline QuickDASH: 38.6% on 05/18/16   Time 4   Period Weeks   Status Achieved   PT LONG TERM  GOAL #3   Title Pt. able to bathe/dress with no R shoulder pain to improve independence/pain-free mobility.    Time 4   Period Weeks   Status Achieved   PT LONG TERM GOAL #4   Title Pt. will increase shoulder flexion/ abd. strength to grossly 3/5 MMT to improve lifting/ carry tasks.    Baseline unable to strength test at time of evaluation.    Time 4   Period Weeks   Status On-going   PT LONG TERM GOAL #5   Title Pt. will decrease QuickDASH to <30% to improve functional mobility.     Baseline QuickDASH: 38.6% on 05/18/16   Time 4   Period Weeks   Status New               Plan - 05/26/16 1221    Clinical Impression Statement Pt displaying tendency for lumbar ext and shoulder shrug during shoulder flex ROM task. Pt demonstrates increase ease with standing flex/abd ROM at this session while performing wall ladder. Pt with increased muscle soreness following increase in exercise intensity; will benefit from careful monitoring of sx and adjustment to strengthening protocol to promote pain free mobility.    Rehab Potential Good   PT Frequency 2x / week   PT Duration 4 weeks   PT Treatment/Interventions ADLs/Self Care Home Management;Cryotherapy;Electrical Stimulation;Therapeutic exercise;Therapeutic activities;Functional mobility training;Neuromuscular re-education;Patient/family education;Manual techniques;Passive range of motion   PT Next Visit Plan follow MD protocol/ increase R shoulder ROM, progress isometrics to tolerance, pain mgmt mobilization grade I-II AP/distraction   PT Home Exercise Plan AROM, isometrics   Consulted and Agree with Plan of Care Patient      Patient will benefit from skilled therapeutic intervention in order to improve the following deficits and impairments:  Hypomobility, Decreased scar mobility, Pain, Decreased strength, Decreased range of motion, Impaired flexibility, Postural dysfunction, Improper body mechanics  Visit Diagnosis: Shoulder joint  stiffness, right  Pain in right shoulder  Muscle weakness (generalized)     Problem List Patient Active Problem List   Diagnosis Date Noted  . 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 S/p reverse total shoulder arthroplasty 04/06/2016   This entire session was performed under direct supervision and direction of a licensed therapist/therapist assistant . I have personally read, edited and approve of the note as written.   Mickel Baas Margeaux Swantek, SPT Huprich,Jason DPT 05/27/2016, 11:17 AM  [facility] [address] [place], [place], [address] Phone: [phone]   Fax:  [phone]  Name: CHARLISE GIOVANETTI MRN: [id number] Date of Birth: March 21, 1931

## 2016-05-31 ENCOUNTER — Ambulatory Visit: Payer: Medicare Other

## 2016-05-31 ENCOUNTER — Encounter: Payer: Self-pay | Admitting: Physical Therapy

## 2016-05-31 DIAGNOSIS — M25611 Stiffness of right shoulder, not elsewhere classified: Secondary | ICD-10-CM

## 2016-05-31 DIAGNOSIS — M25511 Pain in right shoulder: Secondary | ICD-10-CM

## 2016-05-31 DIAGNOSIS — M6281 Muscle weakness (generalized): Secondary | ICD-10-CM

## 2016-05-31 NOTE — Therapy (Signed)
[place] [facility] [address]. [place], [place], [address] Phone: [phone]   Fax:  [phone]  Physical Therapy Treatment  Patient Details  Name: Melanie Salazar MRN: [id number] Date of Birth: 02-01-1931 Referring Provider: Dr. Roland Rack  Encounter Date: 05/31/2016      PT End of Session - 05/31/16 1400    Visit Number 11   Number of Visits 15   Date for PT Re-Evaluation 06/15/16   Authorization - Visit Number 11   Authorization - Number of Visits 16   PT Start Time 4403   PT Stop Time 4742   PT Time Calculation (min) 43 min   Activity Tolerance Patient tolerated treatment well;No increased pain   Behavior During Therapy [facility] for tasks assessed/performed      Past Medical History  Diagnosis Date  . Hypertension   . Arthritis   . GERD (gastroesophageal reflux disease)   . Colitis     Past Surgical History  Procedure Laterality Date  . Shoulder arthroscopy w/ rotator cuff repair Left   . Wrist fracture surgery Left   . Colonoscopy N/A 03/27/2015    Procedure: COLONOSCOPY;  Surgeon: Hulen Luster, MD;  Location: [facility] ENDOSCOPY;  Service: Gastroenterology;  Laterality: N/A;  . Abdominal hysterectomy    . Fracture surgery    . Eye surgery Bilateral     Cataract Extraction with IOL  . Reverse shoulder arthroplasty Right 04/06/2016    Procedure: REVERSE SHOULDER ARTHROPLASTY;  Surgeon: Corky Mull, MD;  Location: [facility] ORS;  Service: Orthopedics;  Laterality: Right;    There were no vitals filed for this visit.      Subjective Assessment - 05/31/16 1121    Subjective Pt reports that she has been doing well over a long weekend. She has resumed her HEP with isometric exercises and theraband and reports no acute increase in pain sx.   Limitations House hold activities;Writing;Lifting   Patient Stated Goals Increase R shoulder AROM/ strength/ decrease pain.    Currently in Pain? No/denies      OBJECTIVE: Manual: PROM of R  shoulder, all planes. Grade I-II LAD of R shoulder for pain relief. There VZ:[id number] R shoulder flexion/ abd./ ER/ IR/ horizontal abd. AROM 10x. Short lever resisted isometerics all planes with 25 deg increments shoulder flx and abduction (4 bouts; 3 min ea.)Shoulder ladder x 10 (flexion/ abd.) Pt demonstrates decrease of substitutions patterns with functional reach.Standing D1/D2 pattern in front of mirror for visual cue. Pt with tendency for shoulder shrug during flx/abd of R shoulder with patterns. Discuss HEP: increase intensity of scapular squeeze to red theraband; emphasize limiting shoulder ext to 0.  Pt response for medical necessity: benefits from AAROM/AROM at this time and isometric strengthening program.Pt demonstrates increased tolerance to higher intensity isometrics (reports 75% of max) at this session and will progress functional strengthening program as tolerated per MD protocol.        PT Long Term Goals - 05/18/16 1136    PT LONG TERM GOAL #1   Title Pt. I with HEP to increase R shoulder flexion/ abd. AROM to >100 deg. to improve washing hair/ADL.     Baseline Supine/seated R shoulder AROM: flexion (121/99 deg.), abd. (88/84 deg.), ER (32 deg.), IR (WFL).        Time 4   Period Weeks   Status Partially Met   PT LONG TERM GOAL #2   Title Pt. will decrease QuickDASH to <40% to improve functional mobility.  Baseline QuickDASH: 38.6% on 05/18/16   Time 4   Period Weeks   Status Achieved   PT LONG TERM GOAL #3   Title Pt. able to bathe/dress with no R shoulder pain to improve independence/pain-free mobility.    Time 4   Period Weeks   Status Achieved   PT LONG TERM GOAL #4   Title Pt. will increase shoulder flexion/ abd. strength to grossly 3/5 MMT to improve lifting/ carry tasks.    Baseline unable to strength test at time of evaluation.    Time 4   Period Weeks   Status On-going   PT LONG TERM GOAL #5   Title Pt. will decrease QuickDASH to <30% to  improve functional mobility.     Baseline QuickDASH: 38.6% on 05/18/16   Time 4   Period Weeks   Status New               Plan - 05/31/16 1401    Clinical Impression Statement Pt progressing well through reverse shoulder protocol. Demonstrating functional flexion/abduction, still progressing towards end range gains with tendency for substitutions (lumbar ext and heel raises during reaches). Pt tolerating increase intensity of isometric strengthening protocol at 75% of max.   Rehab Potential Good   PT Frequency 2x / week   PT Duration 4 weeks   PT Treatment/Interventions ADLs/Self Care Home Management;Cryotherapy;Electrical Stimulation;Therapeutic exercise;Therapeutic activities;Functional mobility training;Neuromuscular re-education;Patient/family education;Manual techniques;Passive range of motion   PT Next Visit Plan follow MD protocol/ increase R shoulder ROM, progress isometrics to tolerance, pain mgmt mobilization grade I-II AP/distraction   PT Home Exercise Plan AROM, isometrics   Consulted and Agree with Plan of Care Patient      Patient will benefit from skilled therapeutic intervention in order to improve the following deficits and impairments:  Hypomobility, Decreased scar mobility, Pain, Decreased strength, Decreased range of motion, Impaired flexibility, Postural dysfunction, Improper body mechanics  Visit Diagnosis: Shoulder joint stiffness, right  Pain in right shoulder  Muscle weakness (generalized)     Problem List Patient Active Problem List   Diagnosis Date Noted  . 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 S/p reverse total shoulder arthroplasty 04/06/2016   This entire session was performed under direct supervision and direction of a licensed therapist/therapist assistant . I have personally read, edited and approve of the note as written.   Mickel Baas Ellah Otte SPT Huprich,Jason DPT 06/01/2016, 10:36 AM  [facility] [address] [place], [place], [address] Phone: [phone]   Fax:  [phone]  Name: Melanie Salazar MRN: [id number] Date of Birth: January 10, 1931

## 2016-06-02 ENCOUNTER — Ambulatory Visit: Payer: Medicare Other

## 2016-06-02 DIAGNOSIS — M6281 Muscle weakness (generalized): Secondary | ICD-10-CM

## 2016-06-02 DIAGNOSIS — M25611 Stiffness of right shoulder, not elsewhere classified: Secondary | ICD-10-CM | POA: Diagnosis not present

## 2016-06-02 DIAGNOSIS — M25511 Pain in right shoulder: Secondary | ICD-10-CM

## 2016-06-02 NOTE — Therapy (Signed)
[place] [facility] [address] [place], [place], [address] Phone: [phone]   Fax:  [phone]  Physical Therapy Treatment  Patient Details  Name: Melanie Salazar MRN: [id number] Date of Birth: 12/02/1930 Referring Provider: Dr. Poggi  Encounter Date: 06/02/2016    Past Medical History  Diagnosis Date  . Hypertension   . Arthritis   . GERD (gastroesophageal reflux disease)   . Colitis     Past Surgical History  Procedure Laterality Date  . Shoulder arthroscopy w/ rotator cuff repair Left   . Wrist fracture surgery Left   . Colonoscopy N/A 03/27/2015    Procedure: COLONOSCOPY;  Surgeon: Paul Y Oh, MD;  Location: [facility] ENDOSCOPY;  Service: Gastroenterology;  Laterality: N/A;  . Abdominal hysterectomy    . Fracture surgery    . Eye surgery Bilateral     Cataract Extraction with IOL  . Reverse shoulder arthroplasty Right 04/06/2016    Procedure: REVERSE SHOULDER ARTHROPLASTY;  Surgeon: John J Poggi, MD;  Location: [facility] ORS;  Service: Orthopedics;  Laterality: Right;    There were no vitals filed for this visit.      Subjective Assessment - 06/02/16 1205    Subjective Pt states her shoulder is regularly sore following HEP exercises. States that she has been doing household chores/washing hair with involved UE.    Limitations House hold activities;Writing;Lifting   Patient Stated Goals Increase R shoulder AROM/ strength/ decrease pain.    Currently in Pain? No/denies      OBJECTIVE: Manual: PROM of R shoulder all planes with emphasis on shoulder abduction. Grade I-II LAD of R shoulder for pain relief. There ex:Supine R shoulder flexion/ abd./ ER/ IR/ horizontal abd. AROM 10x. Short lever resisted isometerics all planes with 25 deg increments shoulder flx and abduction (4 bouts; 3 min ea.)Pt able to tolerate resisted isometrics with no acute increases in pain. Shoulder ladder x 10 (flexion (121 deg)/ abd (90 deg)).AAROM with  cane with emphasis on pain free shoulder abduction range. Discuss HEP: decrease intensity of isometric exercises performed at home, incorporate standing shoulder abd AAROM with cane.  Pt response for medical necessity: benefits from AAROM/AROM at this time and isometric strengthening program.Pt will progress toward functional strengthening program as tolerated per MD protocol.         PT Education - 06/02/16 1208    Education provided Yes   Education Details method of AAROM with cane for shoulder abduction; painfree   Person(s) Educated Patient   Methods Explanation;Demonstration;Verbal cues   Comprehension Verbalized understanding;Returned demonstration             PT Long Term Goals - 05/18/16 1136    PT LONG TERM GOAL #1   Title Pt. I with HEP to increase R shoulder flexion/ abd. AROM to >100 deg. to improve washing hair/ADL.     Baseline Supine/seated R shoulder AROM: flexion (121/99 deg.), abd. (88/84 deg.), ER (32 deg.), IR (WFL).        Time 4   Period Weeks   Status Partially Met   PT LONG TERM GOAL #2   Title Pt. will decrease QuickDASH to <40% to improve functional mobility.     Baseline QuickDASH: 38.6% on 05/18/16   Time 4   Period Weeks   Status Achieved   PT LONG TERM GOAL #3   Title Pt. able to bathe/dress with no R shoulder pain to improve independence/pain-free mobility.    Time 4   Period Weeks     Status Achieved   PT LONG TERM GOAL #4   Title Pt. will increase shoulder flexion/ abd. strength to grossly 3/5 MMT to improve lifting/ carry tasks.    Baseline unable to strength test at time of evaluation.    Time 4   Period Weeks   Status On-going   PT LONG TERM GOAL #5   Title Pt. will decrease QuickDASH to <30% to improve functional mobility.     Baseline QuickDASH: 38.6% on 05/18/16   Time 4   Period Weeks   Status New               Plan - 06/02/16 1209    Clinical Impression Statement Pt demonstrates increased muscle soreness  following end range AROM exercises.Able to achieve 121 deg flexion on wall ladder, 90 deg shoulder abd on wall ladder. Pt very motivated to progress strengthening protocol.   Rehab Potential Good   PT Frequency 2x / week   PT Duration 4 weeks   PT Treatment/Interventions ADLs/Self Care Home Management;Cryotherapy;Electrical Stimulation;Therapeutic exercise;Therapeutic activities;Functional mobility training;Neuromuscular re-education;Patient/family education;Manual techniques;Passive range of motion   PT Next Visit Plan follow MD protocol/ increase R shoulder ROM, progress isometrics to tolerance, pain mgmt mobilization grade I-II AP/distraction   PT Home Exercise Plan AROM, isometrics   Consulted and Agree with Plan of Care Patient      Patient will benefit from skilled therapeutic intervention in order to improve the following deficits and impairments:  Hypomobility, Decreased scar mobility, Pain, Decreased strength, Decreased range of motion, Impaired flexibility, Postural dysfunction, Improper body mechanics  Visit Diagnosis: Pain in right shoulder  Muscle weakness (generalized)  Stiffness of right shoulder, not elsewhere classified     Problem List Patient Active Problem List   Diagnosis Date Noted  . 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 S/p reverse total shoulder arthroplasty 04/06/2016   This entire session was performed under direct supervision and direction of a licensed therapist/therapist assistant . I have personally read, edited and approve of the note as written.   Mickel Baas Denae Zulueta SPT Huprich,Jason PT, DPT 06/02/2016, 1:51 PM  [place] [facility] [address] [place], [place], [address] Phone: [phone]   Fax:  [phone]  Name: Melanie Salazar MRN: [id number] Date of Birth: 08-Jan-1931

## 2016-06-07 ENCOUNTER — Ambulatory Visit: Payer: Medicare Other | Admitting: Physical Therapy

## 2016-06-07 DIAGNOSIS — M25511 Pain in right shoulder: Secondary | ICD-10-CM

## 2016-06-07 DIAGNOSIS — M25611 Stiffness of right shoulder, not elsewhere classified: Secondary | ICD-10-CM | POA: Diagnosis not present

## 2016-06-07 DIAGNOSIS — M6281 Muscle weakness (generalized): Secondary | ICD-10-CM

## 2016-06-07 NOTE — Therapy (Signed)
[place] [facility] [address] [place], [place], [address] Phone: [phone]   Fax:  [phone]  Physical Therapy Treatment  Patient Details  Name: Melanie Salazar MRN: [id number] Date of Birth: [birth year] Referring Provider: Dr. Roland Rack  Encounter Date: 06/07/2016      PT End of Session - 06/08/16 0812    Visit Number 12   Number of Visits 15   Date for PT Re-Evaluation 06/15/16   Authorization - Visit Number 12   Authorization - Number of Visits 16   PT Start Time 1107   PT Stop Time 3149   PT Time Calculation (min) 52 min   Activity Tolerance Patient tolerated treatment well;No increased pain   Behavior During Therapy [facility] for tasks assessed/performed      Past Medical History  Diagnosis Date  . Hypertension   . Arthritis   . GERD (gastroesophageal reflux disease)   . Colitis     Past Surgical History  Procedure Laterality Date  . Shoulder arthroscopy w/ rotator cuff repair Left   . Wrist fracture surgery Left   . Colonoscopy N/A 03/27/2015    Procedure: COLONOSCOPY;  Surgeon: Hulen Luster, MD;  Location: [facility] END[facility]COPY;  Service: Gastroenterology;  Laterality: N/A;  . Abdominal hysterectomy    . Fracture surgery    . Eye surgery Bilateral     Cataract Extraction with IOL  . Reverse shoulder arthroplasty Right 04/06/2016    Procedure: REVERSE SHOULDER ARTHROPLASTY;  Surgeon: Corky Mull, MD;  Location: [facility] ORS;  Service: Orthopedics;  Laterality: Right;    There were no vitals filed for this visit.      Subjective Assessment - 06/07/16 1117    Subjective Pt. states she is getting better and doing more with R shoulder (ie. putting dishes into first shelf in kitchen).  Pt. reports no pain currently at rest and states she is sleeping better at night.     Limitations House hold activities;Writing;Lifting   Patient Stated Goals Increase R shoulder AROM/ strength/ decrease pain.    Currently in Pain? No/denies        OBJECTIVE:There FW:YOVZCH R shoulder flexion/ abd./ ER/ IR/ horizontal abd. AROM 10x. Short lever resisted isometerics all planes with 25 deg increments shoulder flx and abduction (4 bouts; 3 min ea.)Pt able to tolerate resisted isometrics with no acute increases in pain. Nautilus:  20# seated lat. Pull downs/ standing tricep ext./ standing sh. Add./ scap. Retraction/ chest press with wand/ 10# sh. IR 30x each.  Seated UBE 3 min. F/b.  Reassess seated/standing shoulder wand ex. Next tx.     Pt response for medical necessity: benefits from AAROM/AROM at this time and isometric strengthening program.Pt will progress toward functional strengthening program as tolerated per MD protocol.         PT Long Term Goals - 05/18/16 1136    PT LONG TERM GOAL #1   Title Pt. I with HEP to increase R shoulder flexion/ abd. AROM to >100 deg. to improve washing hair/ADL.     Baseline Supine/seated R shoulder AROM: flexion (121/99 deg.), abd. (88/84 deg.), ER (32 deg.), IR (WFL).        Time 4   Period Weeks   Status Partially Met   PT LONG TERM GOAL #2   Title Pt. will decrease QuickDASH to <40% to improve functional mobility.     Baseline QuickDASH: 38.6% on 05/18/16   Time 4   Period Weeks   Status Achieved  PT LONG TERM GOAL #3   Title Pt. able to bathe/dress with no R shoulder pain to improve independence/pain-free mobility.    Time 4   Period Weeks   Status Achieved   PT LONG TERM GOAL #4   Title Pt. will increase shoulder flexion/ abd. strength to grossly 3/5 MMT to improve lifting/ carry tasks.    Baseline unable to strength test at time of evaluation.    Time 4   Period Weeks   Status On-going   PT LONG TERM GOAL #5   Title Pt. will decrease QuickDASH to <30% to improve functional mobility.     Baseline QuickDASH: 38.6% on 05/18/16   Time 4   Period Weeks   Status New            Plan - 06/08/16 1704    Clinical Impression Statement No increase c/o pain with  improved R shoulder flexion noted during seated/standing Nautilus ex. overhead tasks.  Good technique with minimal compensatory UT/ sh. techniques noted.  Pt. remains motivated and working hard with resisted/ strength training ex. program.     Rehab Potential Good   PT Frequency 2x / week   PT Duration 4 weeks   PT Treatment/Interventions ADLs/Self Care Home Management;Cryotherapy;Electrical Stimulation;Therapeutic exercise;Therapeutic activities;Functional mobility training;Neuromuscular re-education;Patient/family education;Manual techniques;Passive range of motion   PT Next Visit Plan follow MD protocol/ increase R shoulder ROM, progress sh. strengthening.  Nautilus ex.  Issue new strengthening HEP.     PT Home Exercise Plan AROM, isometrics   Consulted and Agree with Plan of Care Patient      Patient will benefit from skilled therapeutic intervention in order to improve the following deficits and impairments:  Hypomobility, Decreased scar mobility, Pain, Decreased strength, Decreased range of motion, Impaired flexibility, Postural dysfunction, Improper body mechanics  Visit Diagnosis: Pain in right shoulder  Muscle weakness (generalized)  Stiffness of right shoulder, not elsewhere classified  Shoulder joint stiffness, right     Problem List Patient Active Problem List   Diagnosis Date Noted  . 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 S/p reverse total shoulder arthroplasty 04/06/2016   Pura Spice, PT, DPT # [phone]   06/08/2016, 5:13 PM  [place] [address] [place], [place], [address] Phone: [phone]   Fax:  [phone]  Name: Melanie Salazar MRN: [id number] Date of Birth: 11-30-1930

## 2016-06-09 ENCOUNTER — Ambulatory Visit: Payer: Medicare Other | Admitting: Physical Therapy

## 2016-06-09 ENCOUNTER — Encounter: Payer: Medicare Other | Admitting: Physical Therapy

## 2016-06-09 DIAGNOSIS — M6281 Muscle weakness (generalized): Secondary | ICD-10-CM

## 2016-06-09 DIAGNOSIS — M25511 Pain in right shoulder: Secondary | ICD-10-CM

## 2016-06-09 DIAGNOSIS — M25611 Stiffness of right shoulder, not elsewhere classified: Secondary | ICD-10-CM

## 2016-06-09 NOTE — Therapy (Signed)
[facility] [address] [place], [place], [address] Phone: [phone]   Fax:  [phone]  Physical Therapy Treatment  Patient Details  Name: Melanie Salazar MRN: [id number] Date of Birth: 1931/05/16 Referring Provider: Dr. Roland Rack  Encounter Date: 06/09/2016      PT End of Session - 06/09/16 1328    Visit Number 13   Number of Visits 15   Date for PT Re-Evaluation 06/15/16   Authorization - Visit Number 13   Authorization - Number of Visits 16   PT Start Time 9381   PT Stop Time 1421   PT Time Calculation (min) 55 min   Activity Tolerance Patient tolerated treatment well;No increased pain   Behavior During Therapy [facility] for tasks assessed/performed      Past Medical History  Diagnosis Date  . Hypertension   . Arthritis   . GERD (gastroesophageal reflux disease)   . Colitis     Past Surgical History  Procedure Laterality Date  . Shoulder arthroscopy w/ rotator cuff repair Left   . Wrist fracture surgery Left   . Colonoscopy N/A 03/27/2015    Procedure: COLONOSCOPY;  Surgeon: Hulen Luster, MD;  Location: [facility] ENDOSCOPY;  Service: Gastroenterology;  Laterality: N/A;  . Abdominal hysterectomy    . Fracture surgery    . Eye surgery Bilateral     Cataract Extraction with IOL  . Reverse shoulder arthroplasty Right 04/06/2016    Procedure: REVERSE SHOULDER ARTHROPLASTY;  Surgeon: Corky Mull, MD;  Location: [facility] ORS;  Service: Orthopedics;  Laterality: Right;    There were no vitals filed for this visit.      Subjective Assessment - 06/09/16 1327    Subjective Pt. reports increase R shoulder soreness/ pain after last tx. session (resisted ex.).  Pt. reports doing better today.     Limitations House hold activities;Writing;Lifting   Patient Stated Goals Increase R shoulder AROM/ strength/ decrease pain.    Currently in Pain? Yes   Pain Score 1    Pain Location Shoulder   Pain Orientation Right   Pain Type Surgical pain       OBJECTIVE:There ex: Seated UBE 3 min. F/b. (consistent cadence/ no charge).  Nautilus: 30# seated lat. Pull downs/ 20# standing tricep ext./  standing sh. Add./ 20# scap. Retraction/ 20# chest press with wand/ 10# sh. IR 30x each.  Seated 3# bicep curls 30x.  See new HEP.  Pt. Will ice at home and instructed to contact PT if any questions or concerns prior to next tx.     Pt response for medical necessity: benefits from AAROM/AROM at this time and isometric strengthening program.Pt will progress toward functional strengthening program as tolerated per MD protocol.  PT issued new HEP with proper technique noted.          PT Long Term Goals - 05/18/16 1136    PT LONG TERM GOAL #1   Title Pt. I with HEP to increase R shoulder flexion/ abd. AROM to >100 deg. to improve washing hair/ADL.     Baseline Supine/seated R shoulder AROM: flexion (121/99 deg.), abd. (88/84 deg.), ER (32 deg.), IR (WFL).        Time 4   Period Weeks   Status Partially Met   PT LONG TERM GOAL #2   Title Pt. will decrease QuickDASH to <40% to improve functional mobility.     Baseline QuickDASH: 38.6% on 05/18/16   Time 4   Period Weeks  Status Achieved   PT LONG TERM GOAL #3   Title Pt. able to bathe/dress with no R shoulder pain to improve independence/pain-free mobility.    Time 4   Period Weeks   Status Achieved   PT LONG TERM GOAL #4   Title Pt. will increase shoulder flexion/ abd. strength to grossly 3/5 MMT to improve lifting/ carry tasks.    Baseline unable to strength test at time of evaluation.    Time 4   Period Weeks   Status On-going   PT LONG TERM GOAL #5   Title Pt. will decrease QuickDASH to <30% to improve functional mobility.     Baseline QuickDASH: 38.6% on 05/18/16   Time 4   Period Weeks   Status New            Plan - 06/09/16 1328    Clinical Impression Statement Progressing with R shoulder stabilization ex. program/ Nautilus and RTB ex. program.  Issued  new HEP with minimal cuing for proper set-up/ technique wtih no c/o pain.  Pt. instructed to avoid any pain provoking mvmt. patterns during new HEP.  Moderate B shoulder/ R elbow crepitus noted t/o tx. session wtih no increase c/o pain.     Rehab Potential Good   PT Frequency 2x / week   PT Duration 4 weeks   PT Treatment/Interventions ADLs/Self Care Home Management;Cryotherapy;Electrical Stimulation;Therapeutic exercise;Therapeutic activities;Functional mobility training;Neuromuscular re-education;Patient/family education;Manual techniques;Passive range of motion   PT Next Visit Plan follow MD protocol/ increase R shoulder ROM, progress sh. strengthening.  Nautilus ex.    PT Home Exercise Plan AROM, isometrics      Patient will benefit from skilled therapeutic intervention in order to improve the following deficits and impairments:  Hypomobility, Decreased scar mobility, Pain, Decreased strength, Decreased range of motion, Impaired flexibility, Postural dysfunction, Improper body mechanics  Visit Diagnosis: Pain in right shoulder  Muscle weakness (generalized)  Stiffness of right shoulder, not elsewhere classified  Shoulder joint stiffness, right     Problem List Patient Active Problem List   Diagnosis Date Noted  . 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 S/p reverse total shoulder arthroplasty 04/06/2016   Pura Spice, PT, DPT # [phone]   06/09/2016, 2:46 PM  [facility] [address] [place], [place], [address] Phone: [phone]   Fax:  [phone]  Name: Melanie Salazar MRN: [id number] Date of Birth: Sep 06, 1931

## 2016-06-14 ENCOUNTER — Encounter: Payer: Medicare Other | Admitting: Physical Therapy

## 2016-06-15 ENCOUNTER — Encounter: Payer: Self-pay | Admitting: Physical Therapy

## 2016-06-15 ENCOUNTER — Ambulatory Visit: Payer: Medicare Other | Admitting: Physical Therapy

## 2016-06-15 DIAGNOSIS — M25611 Stiffness of right shoulder, not elsewhere classified: Secondary | ICD-10-CM | POA: Diagnosis not present

## 2016-06-15 DIAGNOSIS — M6281 Muscle weakness (generalized): Secondary | ICD-10-CM

## 2016-06-15 DIAGNOSIS — M25511 Pain in right shoulder: Secondary | ICD-10-CM

## 2016-06-15 NOTE — Therapy (Signed)
[facility] [address] [place], [place], [address] Phone: [phone]   Fax:  [phone]  Physical Therapy Treatment  Patient Details  Name: Melanie Salazar MRN: [id number] Date of Birth: [birth year] Referring Provider: Dr. Roland Rack  Encounter Date: 06/15/2016      PT End of Session - 06/15/16 1215    Visit Number 14   Number of Visits 22   Date for PT Re-Evaluation 07/13/16   Authorization - Visit Number 14   Authorization - Number of Visits 16   PT Start Time 2992   PT Stop Time 1402   PT Time Calculation (min) 49 min   Activity Tolerance Patient tolerated treatment well;Patient limited by pain   Behavior During Therapy [facility] for tasks assessed/performed      Past Medical History:  Diagnosis Date  . Arthritis   . Colitis   . GERD (gastroesophageal reflux disease)   . Hypertension     Past Surgical History:  Procedure Laterality Date  . ABDOMINAL HYSTERECTOMY    . COLONOSCOPY N/A 03/27/2015   Procedure: COLONOSCOPY;  Surgeon: Hulen Luster, MD;  Location: [facility] ENDOSCOPY;  Service: Gastroenterology;  Laterality: N/A;  . EYE SURGERY Bilateral    Cataract Extraction with IOL  . FRACTURE SURGERY    . REVERSE SHOULDER ARTHROPLASTY Right 04/06/2016   Procedure: REVERSE SHOULDER ARTHROPLASTY;  Surgeon: Corky Mull, MD;  Location: [facility] ORS;  Service: Orthopedics;  Laterality: Right;  . SHOULDER ARTHROSCOPY W/ ROTATOR CUFF REPAIR Left   . WRIST FRACTURE SURGERY Left     There were no vitals filed for this visit.      Subjective Assessment - 06/15/16 1212    Subjective Pt states that she had an increase in R shoulder soreness and pain after performing new HEP with resisted bands Thurs/Fri/Sat. She states that she tried the exercises on Sunday but they were too painful. She reports shoulder external rotation and horizonal flexion as being the most painful ther ex with good tolerance of rows/shoulder ext/scapular retraction etc.  She states that she has experienced mild N/T in R hand if she sleeps on her R side at night; states that N/T abates with upright posture.   Limitations House hold activities;Writing;Lifting   Patient Stated Goals Increase R shoulder AROM/ strength/ decrease pain.    Currently in Pain? Yes   Pain Score 3    Pain Location Shoulder   Pain Orientation Right   Pain Type Surgical pain      OBJECTIVE:There ex: Reviewed HEP with performance of: resisted ER, horizontal flexion, resisted IR, seated flexion with wand; pt with reports of increase in pain performing HEP over the weekend. Pt with tendency for wrist flexion and trunk rotation when performing resisted ER secondary to onset of pain. Nautilus: 30# seated lat. Pull downs 2x10/ 20# standing tricep ext 2x10, 20# chest press with wand 2x10.      Pt response for medical necessity: benefits from AAROM/AROM at this time and isometric strengthening program.Pt will progress toward functional strengthening program as tolerated per MD protocol.  PT issued new HEP with proper technique noted.        PT Education - 06/15/16 1215    Education provided Yes   Education Details reviewed HEP with modifications of painful ther ex/frequency of exercise   Person(s) Educated Patient   Methods Explanation;Demonstration   Comprehension Verbalized understanding;Returned demonstration             PT Long Term Goals -  06/15/16 1239      PT LONG TERM GOAL #1   Title Pt. I with HEP to increase R shoulder flexion/ abd. AROM to >100 deg. to improve washing hair/ADL.     Baseline 6/27: Supine/seated R shoulder AROM: flexion (121/99 deg.), abd. (88/84 deg.), ER (32 deg.), IR (WFL). 7/25: R shoulder flexion 110, abduction 86, external rotation 38, internal rotation 75   Time 4   Period Weeks   Status Partially Met     PT LONG TERM GOAL #2   Title Pt. will decrease QuickDASH to <40% to improve functional mobility.     Baseline QuickDASH:  38.6% on 05/18/16   Time 4   Period Weeks   Status Achieved     PT LONG TERM GOAL #3   Title Pt. able to bathe/dress with no R shoulder pain to improve independence/pain-free mobility.    Baseline 7/25 pt limited in ability to perform dressing/bathing activities with RUE, but able to compensate with LLE   Time 4   Period Weeks   Status Partially Met     PT LONG TERM GOAL #4   Title Pt. will increase shoulder flexion/ abd. strength to 4+/5 MMT (flex/abd/ir/elbowflx/elbowext) 4-/5 (pain free ER) to improve lifting/ carry tasks.    Baseline 7/25: R shoulder flexion 4/5, abduction 4/5, elbow flexion 4/5, elbow extension 4/5, external rotation 3+/5 (pain limited), internal rotation 4/5   Time 4   Status Revised     PT LONG TERM GOAL #5   Title Pt. will decrease QuickDASH to <30% to improve functional mobility.     Baseline QuickDASH: 38.6% on 05/18/16. 06/15/16: 45.5% due to recent exacerbation with increase in exercise intensity   Time 4   Period Weeks   Status On-going     Additional Long Term Goals   Additional Long Term Goals Yes     PT LONG TERM GOAL #6   Title Pt will report no greater than 2/10 following resisted AROM/functional mobility exercises to promote return to ADLs   Baseline pt with recent pain exacerbation following resisted AROM ther ex with c/o 4/10 pain   Time 4   Period Weeks   Status New           Plan - 06/15/16 1216    Clinical Impression Statement Pt is 10 weeks s/p total shoulder arthroplasty. She demonstrates hypomobility of R shoulder (in sitting: flex 110, abd 86; in supine ER 38, IR 75) and R shoulder weakness with complaints of moderate muscle soreness following increase in exercise intensity. She is currently progressing through resisted AROM with c/o moderate pain most notably with resisted ER. She is able to tolerate MMT of RUE this date with R flex 4/5, abd 4/5, ER 3+/5 (pain limited), IR 4/5.   Rehab Potential Good   PT Frequency 2x / week   PT  Duration 4 weeks   PT Treatment/Interventions ADLs/Self Care Home Management;Cryotherapy;Electrical Stimulation;Therapeutic exercise;Therapeutic activities;Functional mobility training;Neuromuscular re-education;Patient/family education;Manual techniques;Passive range of motion   PT Next Visit Plan follow MD protocol/ increase R shoulder ROM, progress sh. strengthening.  Nautilus ex.    Consulted and Agree with Plan of Care Patient      Patient will benefit from skilled therapeutic intervention in order to improve the following deficits and impairments:  Hypomobility, Decreased scar mobility, Pain, Decreased strength, Decreased range of motion, Impaired flexibility, Postural dysfunction, Improper body mechanics  Visit Diagnosis: Pain in right shoulder  Muscle weakness (generalized)  Stiffness of right shoulder, not elsewhere  classified  Shoulder joint stiffness, right     Problem List Patient Active Problem List   Diagnosis Date Noted  . 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 S/p reverse total shoulder arthroplasty 04/06/2016   Pura Spice, PT, DPT # [id number] Derrill Memo, SPT 06/15/2016, 12:48 PM  [facility] [address] [place], [place], [address] Phone: [phone]   Fax:  [phone]  Name: Melanie Salazar MRN: [id number] Date of Birth: [birth year]

## 2016-06-16 ENCOUNTER — Encounter: Payer: Medicare Other | Admitting: Physical Therapy

## 2016-06-17 ENCOUNTER — Ambulatory Visit: Payer: Medicare Other | Admitting: Physical Therapy

## 2016-06-17 DIAGNOSIS — M25511 Pain in right shoulder: Secondary | ICD-10-CM

## 2016-06-17 DIAGNOSIS — M6281 Muscle weakness (generalized): Secondary | ICD-10-CM

## 2016-06-17 DIAGNOSIS — M25611 Stiffness of right shoulder, not elsewhere classified: Secondary | ICD-10-CM | POA: Diagnosis not present

## 2016-06-18 NOTE — Therapy (Signed)
[place] [facility] [address] [place], [place], [address] Phone: [phone]   Fax:  [phone]  Physical Therapy Treatment  Patient Details  Name: Melanie Salazar MRN: [id number] Date of Birth: 05/22/1931 Referring Provider: Dr. Roland Rack  Encounter Date: 06/17/2016      PT End of Session - 06/17/16 1123    Visit Number 15   Number of Visits 22   Date for PT Re-Evaluation 07/13/16   Authorization - Visit Number 15   Authorization - Number of Visits 24   PT Start Time 1112   PT Stop Time 1212   PT Time Calculation (min) 60 min   Activity Tolerance Patient tolerated treatment well;Patient limited by pain   Behavior During Therapy [facility] for tasks assessed/performed      Past Medical History:  Diagnosis Date  . Arthritis   . Colitis   . GERD (gastroesophageal reflux disease)   . Hypertension     Past Surgical History:  Procedure Laterality Date  . ABDOMINAL HYSTERECTOMY    . COLONOSCOPY N/A 03/27/2015   Procedure: COLONOSCOPY;  Surgeon: Hulen Luster, MD;  Location: [facility] ENDOSCOPY;  Service: Gastroenterology;  Laterality: N/A;  . EYE SURGERY Bilateral    Cataract Extraction with IOL  . FRACTURE SURGERY    . REVERSE SHOULDER ARTHROPLASTY Right 04/06/2016   Procedure: REVERSE SHOULDER ARTHROPLASTY;  Surgeon: Corky Mull, MD;  Location: [facility] ORS;  Service: Orthopedics;  Laterality: Right;  . SHOULDER ARTHROSCOPY W/ ROTATOR CUFF REPAIR Left   . WRIST FRACTURE SURGERY Left     There were no vitals filed for this visit.      Subjective Assessment - 06/17/16 1115    Subjective Pt. didn't complete theraband for 2 days to alleviate the muscle soreness and reports doing better.  No pain in R shoulder but minimal c/o muscle soreness.  Pt. able to vacuum home with no issues reported.     Limitations House hold activities;Writing;Lifting   Patient Stated Goals Increase R shoulder AROM/ strength/ decrease pain.    Currently in Pain?  No/denies      OBJECTIVE:There ex: B UBE 3 min. F/b (warm-up/no charge).  Standing/supine B shoulder flexion A/AROM 10x each.  Supine serratus punches with feedback 20x.    Nautilus: 30# seated lat. Pull downs 2x10/ 20# standing tricep ext 2x10, 10# chest press with wand 12x (difficulty with chest press today due to pain).  Standing wall/ overhead ex.  Reinforced benefit of rest 2-3 days between resisted HEP.  Reviewed technique with HEP (slight modification with shoulder flexion).      Pt response for medical necessity: benefits from AAROM/AROM at this time and isometric strengthening program.Pt will progress toward functional strengthening program as tolerated per MD protocol.       PT Long Term Goals - 06/15/16 1239      PT LONG TERM GOAL #1   Title Pt. I with HEP to increase R shoulder flexion/ abd. AROM to >100 deg. to improve washing hair/ADL.     Baseline 6/27: Supine/seated R shoulder AROM: flexion (121/99 deg.), abd. (88/84 deg.), ER (32 deg.), IR (WFL). 7/25: R shoulder flexion 110, abduction 86, external rotation 38, internal rotation 75   Time 4   Period Weeks   Status Partially Met     PT LONG TERM GOAL #2   Title Pt. will decrease QuickDASH to <40% to improve functional mobility.     Baseline QuickDASH: 38.6% on 05/18/16   Time 4  Period Weeks   Status Achieved     PT LONG TERM GOAL #3   Title Pt. able to bathe/dress with no R shoulder pain to improve independence/pain-free mobility.    Baseline 7/25 pt limited in ability to perform dressing/bathing activities with RUE, but able to compensate with LLE   Time 4   Period Weeks   Status Partially Met     PT LONG TERM GOAL #4   Title Pt. will increase shoulder flexion/ abd. strength to 4+/5 MMT (flex/abd/ir/elbowflx/elbowext) 4-/5 (pain free ER) to improve lifting/ carry tasks.    Baseline 7/25: R shoulder flexion 4/5, abduction 4/5, elbow flexion 4/5, elbow extension 4/5, external rotation 3+/5 (pain  limited), internal rotation 4/5   Time 4   Status Revised     PT LONG TERM GOAL #5   Title Pt. will decrease QuickDASH to <30% to improve functional mobility.     Baseline QuickDASH: 38.6% on 05/18/16. 06/15/16: 45.5% due to recent exacerbation with increase in exercise intensity   Time 4   Period Weeks   Status On-going     Additional Long Term Goals   Additional Long Term Goals Yes     PT LONG TERM GOAL #6   Title Pt will report no greater than 2/10 following resisted AROM/functional mobility exercises to promote return to ADLs   Baseline pt with recent pain exacerbation following resisted AROM ther ex with c/o 4/10 pain   Time 4   Period Weeks   Status New               Plan - 06/17/16 1124    Clinical Impression Statement Pt. has benefitted from decrease frequency of resisted ex. program with several days of rest/ ROM ex. in between.  Pt. has progressed with daily functional mobility household tasks.  No increase c/o pain reported tx. session and rhythmic stabs in supine position.  Pt. will return to PT in 2 weeks and instructed to contact PT if any further issues.     Rehab Potential Good   PT Frequency 2x / week   PT Duration 4 weeks   PT Treatment/Interventions ADLs/Self Care Home Management;Cryotherapy;Electrical Stimulation;Therapeutic exercise;Therapeutic activities;Functional mobility training;Neuromuscular re-education;Patient/family education;Manual techniques;Passive range of motion   PT Home Exercise Plan AROM, isometrics/ therabands   Consulted and Agree with Plan of Care Patient      Patient will benefit from skilled therapeutic intervention in order to improve the following deficits and impairments:  Hypomobility, Decreased scar mobility, Pain, Decreased strength, Decreased range of motion, Impaired flexibility, Postural dysfunction, Improper body mechanics  Visit Diagnosis: Pain in right shoulder  Muscle weakness (generalized)  Stiffness of right  shoulder, not elsewhere classified  Shoulder joint stiffness, right     Problem List Patient Active Problem List   Diagnosis Date Noted  . 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 S/p reverse total shoulder arthroplasty 04/06/2016   Pura Spice, PT, DPT # [phone]  06/18/2016, 3:44 PM  [facility] [address] [place], [place], [address] Phone: [phone]   Fax:  [phone]  Name: Melanie Salazar MRN: [id number] Date of Birth: 25-Jun-1931

## 2016-06-29 ENCOUNTER — Ambulatory Visit: Payer: Medicare Other | Attending: Surgery | Admitting: Physical Therapy

## 2016-06-29 ENCOUNTER — Encounter: Payer: Self-pay | Admitting: Physical Therapy

## 2016-06-29 DIAGNOSIS — M6281 Muscle weakness (generalized): Secondary | ICD-10-CM | POA: Insufficient documentation

## 2016-06-29 DIAGNOSIS — M25511 Pain in right shoulder: Secondary | ICD-10-CM | POA: Diagnosis not present

## 2016-06-29 DIAGNOSIS — M25611 Stiffness of right shoulder, not elsewhere classified: Secondary | ICD-10-CM | POA: Diagnosis present

## 2016-06-29 NOTE — Therapy (Signed)
[place] [facility] [address]. [place], [place], [address] Phone: [phone]   Fax:  [phone]  Physical Therapy Treatment  Patient Details  Name: Melanie Salazar MRN: [id number] Date of Birth: October 17, 1931 Referring Provider: Dr. Roland Rack  Encounter Date: 06/29/2016      PT End of Session - 06/29/16 1218    Visit Number 16   Number of Visits 22   Date for PT Re-Evaluation 07/13/16   Authorization - Visit Number 16   Authorization - Number of Visits 24   PT Start Time 1110   PT Stop Time 1151   PT Time Calculation (min) 41 min   Activity Tolerance Patient limited by pain;Patient tolerated treatment well   Behavior During Therapy [facility] for tasks assessed/performed      Past Medical History:  Diagnosis Date  . Arthritis   . Colitis   . GERD (gastroesophageal reflux disease)   . Hypertension     Past Surgical History:  Procedure Laterality Date  . ABDOMINAL HYSTERECTOMY    . COLONOSCOPY N/A 03/27/2015   Procedure: COLONOSCOPY;  Surgeon: Hulen Luster, MD;  Location: [facility] ENDOSCOPY;  Service: Gastroenterology;  Laterality: N/A;  . EYE SURGERY Bilateral    Cataract Extraction with IOL  . FRACTURE SURGERY    . REVERSE SHOULDER ARTHROPLASTY Right 04/06/2016   Procedure: REVERSE SHOULDER ARTHROPLASTY;  Surgeon: Corky Mull, MD;  Location: [facility] ORS;  Service: Orthopedics;  Laterality: Right;  . SHOULDER ARTHROSCOPY W/ ROTATOR CUFF REPAIR Left   . WRIST FRACTURE SURGERY Left     There were no vitals filed for this visit.      Subjective Assessment - 06/29/16 1216    Subjective Pt has performed theraband exercises 4x since last visit on 7/27. She states that she is dealing with increased muscle soreness and pain in R shoulder but notes that along with increase intensity in exercise, she is also performing more household duties with [place]. She states when she doesn't use RUE.   Limitations House hold activities;Writing;Lifting   Patient  Stated Goals Increase R shoulder AROM/ strength/ decrease pain.    Currently in Pain? No/denies      Objective:  Therapeutic Exercise: UBE 31mn frwd/bkwrd (warm up). Seated shoulder AROM flexion/abd/scaption/ER/IR x10 ea. Supine shoulder AAROM with ER/abd x10 ea. Sidelying ER with towel roll and 1# pt initially able to tolerate, but with repeated reps begins substitution for ER with elbow ext/trunk rotation to avoid pain. Lat pull downs #20 with onset of shoulder pain. Scapular retractions 3x15 #20 with good tolerance. Reviewed HEP with modifications.  Manual tx: PROM R shoulder all planes. Pt with good tolerance of flex/abd/ir but reported increase in pain with shoulder ER past neutral.   Pt response for medical necessity: Pt experiencing R shoulder pain and muscle soreness with increase in exercise intensity and activity level with ADLs. She demonstrates overall improvement in tolerable ROM in R shoulder but continues to experience pain esp with end range ER. Pt with functional shoulder flexion ROM with remaining limitations in ER/abd; demonstrates tendency for shrug with shoulder abd.        PT Education - 06/29/16 1217    Education provided Yes   Education Details Modified HEP with elimination of painful exercises; reissued isometrics to be completed at 25% max and focus on tolerable AROM.   Person(s) Educated Patient   Methods Explanation;Demonstration;Handout   Comprehension Verbalized understanding;Returned demonstration  PT Long Term Goals - 06/15/16 1239      PT LONG TERM GOAL #1   Title Pt. I with HEP to increase R shoulder flexion/ abd. AROM to >100 deg. to improve washing hair/ADL.     Baseline 6/27: Supine/seated R shoulder AROM: flexion (121/99 deg.), abd. (88/84 deg.), ER (32 deg.), IR (WFL). 7/25: R shoulder flexion 110, abduction 86, external rotation 38, internal rotation 75   Time 4   Period Weeks   Status Partially Met     PT LONG TERM GOAL #2    Title Pt. will decrease QuickDASH to <40% to improve functional mobility.     Baseline QuickDASH: 38.6% on 05/18/16   Time 4   Period Weeks   Status Achieved     PT LONG TERM GOAL #3   Title Pt. able to bathe/dress with no R shoulder pain to improve independence/pain-free mobility.    Baseline 7/25 pt limited in ability to perform dressing/bathing activities with RUE, but able to compensate with LLE   Time 4   Period Weeks   Status Partially Met     PT LONG TERM GOAL #4   Title Pt. will increase shoulder flexion/ abd. strength to 4+/5 MMT (flex/abd/ir/elbowflx/elbowext) 4-/5 (pain free ER) to improve lifting/ carry tasks.    Baseline 7/25: R shoulder flexion 4/5, abduction 4/5, elbow flexion 4/5, elbow extension 4/5, external rotation 3+/5 (pain limited), internal rotation 4/5   Time 4   Status Revised     PT LONG TERM GOAL #5   Title Pt. will decrease QuickDASH to <30% to improve functional mobility.     Baseline QuickDASH: 38.6% on 05/18/16. 06/15/16: 45.5% due to recent exacerbation with increase in exercise intensity   Time 4   Period Weeks   Status On-going     Additional Long Term Goals   Additional Long Term Goals Yes     PT LONG TERM GOAL #6   Title Pt will report no greater than 2/10 following resisted AROM/functional mobility exercises to promote return to ADLs   Baseline pt with recent pain exacerbation following resisted AROM ther ex with c/o 4/10 pain   Time 4   Period Weeks   Status New            Plan - 06/29/16 1219    Clinical Impression Statement Pt demonstrates improvement in R shoulder ROM with flex (137) abd (84) ER (24). She is most limited by pain with external rotation AROM/PROM. Pt progressing with household duties and performing greater intensity of ther ex independently.   PT Frequency 2x / week   PT Duration 4 weeks   PT Treatment/Interventions ADLs/Self Care Home Management;Cryotherapy;Electrical Stimulation;Therapeutic exercise;Therapeutic  activities;Functional mobility training;Neuromuscular re-education;Patient/family education;Manual techniques;Passive range of motion   PT Next Visit Plan follow MD protocol/ increase R shoulder ROM, progress sh. strengthening.  Nautilus ex.    PT Home Exercise Plan AROM, isometrics/ therabands   Consulted and Agree with Plan of Care Patient      Patient will benefit from skilled therapeutic intervention in order to improve the following deficits and impairments:  Hypomobility, Decreased scar mobility, Pain, Decreased strength, Decreased range of motion, Impaired flexibility, Postural dysfunction, Improper body mechanics  Visit Diagnosis: Pain in right shoulder  Muscle weakness (generalized)  Stiffness of right shoulder, not elsewhere classified  Shoulder joint stiffness, right     Problem List Patient Active Problem List   Diagnosis Date Noted  . 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  S/p reverse total shoulder arthroplasty 04/06/2016   Pura Spice, PT, DPT # [phone] Derrill Memo, SPT 06/29/2016, 5:35 PM  [facility] [address] [place], [place], [address] Phone: [phone]   Fax:  [phone]  Name: Melanie Salazar MRN: [id number] Date of Birth: 02/17/1931

## 2016-07-06 ENCOUNTER — Ambulatory Visit: Payer: Medicare Other | Admitting: Physical Therapy

## 2016-07-06 ENCOUNTER — Encounter: Payer: Self-pay | Admitting: Physical Therapy

## 2016-07-06 DIAGNOSIS — M25511 Pain in right shoulder: Secondary | ICD-10-CM

## 2016-07-06 DIAGNOSIS — M25611 Stiffness of right shoulder, not elsewhere classified: Secondary | ICD-10-CM

## 2016-07-06 DIAGNOSIS — M6281 Muscle weakness (generalized): Secondary | ICD-10-CM

## 2016-07-06 NOTE — Therapy (Signed)
[facility] [facility] [address] [place], [place], [address] Phone: [phone]   Fax:  [phone]  Physical Therapy Treatment  Patient Details  Name: Melanie Salazar MRN: [id number] Date of Birth: [birth year] Referring Provider: Dr. Roland Rack  Encounter Date: 07/06/2016      PT End of Session - 07/06/16 1604    Visit Number 17   Number of Visits 22   Date for PT Re-Evaluation 07/13/16   Authorization - Visit Number 17   Authorization - Number of Visits 24   PT Start Time 1607   PT Stop Time 3710   PT Time Calculation (min) 35 min   Activity Tolerance Patient limited by pain;Patient tolerated treatment well   Behavior During Therapy [facility] for tasks assessed/performed      Past Medical History:  Diagnosis Date  . Arthritis   . Colitis   . GERD (gastroesophageal reflux disease)   . Hypertension     Past Surgical History:  Procedure Laterality Date  . ABDOMINAL HYSTERECTOMY    . COLONOSCOPY N/A 03/27/2015   Procedure: COLONOSCOPY;  Surgeon: Hulen Luster, MD;  Location: [facility] ENDOSCOPY;  Service: Gastroenterology;  Laterality: N/A;  . EYE SURGERY Bilateral    Cataract Extraction with IOL  . FRACTURE SURGERY    . REVERSE SHOULDER ARTHROPLASTY Right 04/06/2016   Procedure: REVERSE SHOULDER ARTHROPLASTY;  Surgeon: Corky Mull, MD;  Location: [facility] ORS;  Service: Orthopedics;  Laterality: Right;  . SHOULDER ARTHROSCOPY W/ ROTATOR CUFF REPAIR Left   . WRIST FRACTURE SURGERY Left     There were no vitals filed for this visit.      Subjective Assessment - 07/06/16 1601    Subjective Pt states that she is doing better overall with modification of home exercise program. She states that she still experiences R shoulder pain with end range abduction and external rotation; notes that the household task that is most painful is scrubbing/wiping motions. States that she wants to take it easy on her shoulder today because she has housecleaning duties  to perform after her session.   Limitations House hold activities;Writing;Lifting   Patient Stated Goals Increase R shoulder AROM/ strength/ decrease pain.    Currently in Pain? No/denies      Objective:  Therapeutic Exercise: D1/D2 patterns with RUE; pt with immediate pain performing D2 secondary to abd/ER with better tolerance of D1. Wall push ups x10 with bilateral UE. Pulleys with IR emphasis; pt requires cueing to decrease compensatory elbow flexion, shows tendency for shrug. Sidelying sleeper stretch for IR. ER AROM with pain at end range (limited to <30 deg). Ball circles on wall with long lever at 90 deg shoulder flexion 2x1 minute.  Pt response for medical necessity: Pt benefited from decrease in intensity of home exercise program; only experiencing R shoulder pain with end range abd/er and during household tasks involving abd/er. She is able to perform ADLs/IADLs with minimal R shoulder pain.      PT Education - 07/06/16 1603    Education provided Yes   Education Details See Patient Instructions: IR AROM and wall push ups   Person(s) Educated Patient   Methods Explanation;Demonstration;Handout   Comprehension Verbalized understanding;Returned demonstration             PT Long Term Goals - 06/15/16 1239      PT LONG TERM GOAL #1   Title Pt. I with HEP to increase R shoulder flexion/ abd. AROM to >100 deg. to improve washing hair/ADL.  Baseline 6/27: Supine/seated R shoulder AROM: flexion (121/99 deg.), abd. (88/84 deg.), ER (32 deg.), IR (WFL). 7/25: R shoulder flexion 110, abduction 86, external rotation 38, internal rotation 75   Time 4   Period Weeks   Status Partially Met     PT LONG TERM GOAL #2   Title Pt. will decrease QuickDASH to <40% to improve functional mobility.     Baseline QuickDASH: 38.6% on 05/18/16   Time 4   Period Weeks   Status Achieved     PT LONG TERM GOAL #3   Title Pt. able to bathe/dress with no R shoulder pain to improve  independence/pain-free mobility.    Baseline 7/25 pt limited in ability to perform dressing/bathing activities with RUE, but able to compensate with LLE   Time 4   Period Weeks   Status Partially Met     PT LONG TERM GOAL #4   Title Pt. will increase shoulder flexion/ abd. strength to 4+/5 MMT (flex/abd/ir/elbowflx/elbowext) 4-/5 (pain free ER) to improve lifting/ carry tasks.    Baseline 7/25: R shoulder flexion 4/5, abduction 4/5, elbow flexion 4/5, elbow extension 4/5, external rotation 3+/5 (pain limited), internal rotation 4/5   Time 4   Status Revised     PT LONG TERM GOAL #5   Title Pt. will decrease QuickDASH to <30% to improve functional mobility.     Baseline QuickDASH: 38.6% on 05/18/16. 06/15/16: 45.5% due to recent exacerbation with increase in exercise intensity   Time 4   Period Weeks   Status On-going     Additional Long Term Goals   Additional Long Term Goals Yes     PT LONG TERM GOAL #6   Title Pt will report no greater than 2/10 following resisted AROM/functional mobility exercises to promote return to ADLs   Baseline pt with recent pain exacerbation following resisted AROM ther ex with c/o 4/10 pain   Time 4   Period Weeks   Status New            Plan - 07/06/16 1605    Clinical Impression Statement Pt shows good independent management of home strengthening program with no acute increase in pain when performing regular exercises. She remains limited in shoulder ROM most notably in abduction and external rotation. Emphasis this session on internal rotation AROM with pt demonstrating good tolerance of pulleys with IR emphasis and sleeper stretch with mild OP.   Rehab Potential Good   PT Frequency 2x / week   PT Duration 4 weeks   PT Treatment/Interventions ADLs/Self Care Home Management;Cryotherapy;Electrical Stimulation;Therapeutic exercise;Therapeutic activities;Functional mobility training;Neuromuscular re-education;Patient/family education;Manual  techniques;Passive range of motion   PT Next Visit Plan follow MD protocol/ increase R shoulder ROM, progress sh. strengthening.  Nautilus ex.    PT Home Exercise Plan AROM, isometrics/ therabands   Consulted and Agree with Plan of Care Patient      Patient will benefit from skilled therapeutic intervention in order to improve the following deficits and impairments:  Hypomobility, Decreased scar mobility, Pain, Decreased strength, Decreased range of motion, Impaired flexibility, Postural dysfunction, Improper body mechanics  Visit Diagnosis: Pain in right shoulder  Muscle weakness (generalized)  Stiffness of right shoulder, not elsewhere classified     Problem List Patient Active Problem List   Diagnosis Date Noted  . 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 S/p reverse total shoulder arthroplasty 04/06/2016   Pura Spice, PT, DPT # [phone] Derrill Memo, SPT 07/07/2016, 9:29 AM  [place]  [facility] [address]. [place], [place], [address] Phone: [phone]   Fax:  [phone]  Name: Melanie Salazar MRN: [id number] Date of Birth: Mar 22, 1931

## 2016-07-13 ENCOUNTER — Encounter: Payer: Self-pay | Admitting: Physical Therapy

## 2016-07-13 ENCOUNTER — Ambulatory Visit: Payer: Medicare Other | Admitting: Physical Therapy

## 2016-07-13 DIAGNOSIS — M25511 Pain in right shoulder: Secondary | ICD-10-CM | POA: Diagnosis not present

## 2016-07-13 DIAGNOSIS — M25611 Stiffness of right shoulder, not elsewhere classified: Secondary | ICD-10-CM

## 2016-07-13 DIAGNOSIS — M6281 Muscle weakness (generalized): Secondary | ICD-10-CM

## 2016-07-13 NOTE — Therapy (Signed)
[facility] [facility] [address] [place], [place], [address] Phone: [phone]   Fax:  [phone]  Physical Therapy Treatment  Patient Details  Name: Melanie Salazar MRN: [id number] Date of Birth: [birth year] Referring Provider: Dr. Roland Rack  Encounter Date: 07/13/2016      PT End of Session - 07/13/16 1159    Visit Number 18   Number of Visits 22   Date for PT Re-Evaluation 07/13/16   Authorization - Visit Number 18   Authorization - Number of Visits 24   PT Start Time 1102   PT Stop Time 1146   PT Time Calculation (min) 44 min   Activity Tolerance Patient tolerated treatment well   Behavior During Therapy [facility] for tasks assessed/performed      Past Medical History:  Diagnosis Date  . Arthritis   . Colitis   . GERD (gastroesophageal reflux disease)   . Hypertension     Past Surgical History:  Procedure Laterality Date  . ABDOMINAL HYSTERECTOMY    . COLONOSCOPY N/A 03/27/2015   Procedure: COLONOSCOPY;  Surgeon: Hulen Luster, MD;  Location: [facility] ENDOSCOPY;  Service: Gastroenterology;  Laterality: N/A;  . EYE SURGERY Bilateral    Cataract Extraction with IOL  . FRACTURE SURGERY    . REVERSE SHOULDER ARTHROPLASTY Right 04/06/2016   Procedure: REVERSE SHOULDER ARTHROPLASTY;  Surgeon: Corky Mull, MD;  Location: [facility] ORS;  Service: Orthopedics;  Laterality: Right;  . SHOULDER ARTHROSCOPY W/ ROTATOR CUFF REPAIR Left   . WRIST FRACTURE SURGERY Left     There were no vitals filed for this visit.      Subjective Assessment - 07/13/16 1156    Subjective Pt reports that she has noticed some mild improvements in shoulder ROM over the last week but states she still feels limited in reaching out toward her side. She is experiencing less pain with household tasks and her therapeutic exercise program is not aggravating with current intensity level. Pt scheduled to see MD 8/23 and will f/u with PT as needed.   Limitations House hold  activities;Writing;Lifting   Patient Stated Goals Increase R shoulder AROM/ strength/ decrease pain.    Currently in Pain? No/denies      Objective:  Therapeutic Exercise: Review/Discussion of HEP including: Standing rows RTB 1x10, GTB 1x10. Standing shoulder ext RTB 1x10. Standing shoulder ER/IR RTB 1x10 ea. Standing shoulder add RTB 2x10. Chest press with Nautilus #10 2x10. Doorway pec minor stretch 45 sec. Reinforced compliance with shoulder A/AROM to promote maintenance/increased R shoulder mobility.  Pt response for medical necessity: Pt demonstrates functional R shoulder mobility but continues to be limited with respect to full ROM. She presents with improvement of R shoulder strength and improved tolerance of therapeutic exercise program with no c/o of shoulder pain following tx session. Pt will benefit from independent progression of strengthening/mobility program.      PT Education - 07/13/16 1158    Education provided Yes   Education Details see patient instructions: pec minor stretch, increase theraband intensity of resisted exercise   Person(s) Educated Patient   Methods Explanation;Demonstration;Handout   Comprehension Verbalized understanding;Returned demonstration             PT Long Term Goals - 07/13/16 1106      PT LONG TERM GOAL #1   Title Pt. I with HEP to increase R shoulder flexion/ abd. AROM to >100 deg. to improve washing hair/ADL.     Baseline 6/27: Supine/seated R shoulder AROM: flexion (121/99  deg.), abd. (88/84 deg.), ER (32 deg.), IR (WFL). 7/25: R shoulder flexion 110, abduction 86, external rotation 38, internal rotation 75. 8/22: flexion 134 abd 98 er 37 internal 80   Time 4   Period Weeks   Status Achieved     PT LONG TERM GOAL #2   Title Pt. will decrease QuickDASH to <40% to improve functional mobility.     Baseline QuickDASH: 38.6% on 05/18/16   Time 4   Period Weeks   Status Achieved     PT LONG TERM GOAL #3   Title Pt. able to  bathe/dress with no R shoulder pain to improve independence/pain-free mobility.    Baseline 7/25 pt limited in ability to perform dressing/bathing activities with RUE, but able to compensate with LLE. 8/22: pt with tendency for partial compensation with LUE   Time 4   Period Weeks   Status Partially Met     PT LONG TERM GOAL #4   Title Pt. will increase shoulder flexion/ abd. strength to 4+/5 MMT (flex/abd/ir/elbowflx/elbowext) 4-/5 (pain free ER) to improve lifting/ carry tasks.    Baseline 7/25: R shoulder flexion 4/5, abduction 4/5, elbow flexion 4/5, elbow extension 4/5, external rotation 3+/5 (pain limited), internal rotation 4/5 8/22: R shoulder flexion 4+, shoulder abd 4, elbow flex 4+, elbow ext 4, external rotation 4+, internal rotation 4+   Time 4   Period Weeks   Status Achieved     PT LONG TERM GOAL #5   Title Pt. will decrease QuickDASH to <30% to improve functional mobility.     Baseline QuickDASH: 38.6% on 05/18/16. 06/15/16: 45.5% due to recent exacerbation with increase in exercise intensity. 8/22: 15%   Time 4   Period Weeks   Status Achieved     PT LONG TERM GOAL #6   Title Pt will report no greater than 2/10 following resisted AROM/functional mobility exercises to promote return to ADLs   Baseline pt with recent pain exacerbation following resisted AROM ther ex with c/o 4/10 pain    Time 4   Period Weeks   Status Achieved               Plan - 07/13/16 1200    Clinical Impression Statement Pt has demonstrated improvement of functional mobility over the last several weeks. She is performing her HEP regularly and is continuing to progress in terms of strength and ROM of R shoulder. Currently her R shoulder ROM is:  flexion 134 abd 98 ER 37 IR 80. Strength MMT: R shoulder flexion 4+, shoulder abd 4, elbow flex 4+, elbow ext 4, external rotation 4+, internal rotation 4+. Pt has demonstrated good independent progression of HEP with focus on mobility and strengthening.   PT is planning on discharging pt. to focus on a more independent HEP and instructed to contact PT if any issues in future.     Rehab Potential Good   PT Frequency 2x / week   PT Duration 4 weeks   PT Treatment/Interventions ADLs/Self Care Home Management;Cryotherapy;Electrical Stimulation;Therapeutic exercise;Therapeutic activities;Functional mobility training;Neuromuscular re-education;Patient/family education;Manual techniques;Passive range of motion   PT Home Exercise Plan AROM, isometrics/ therabands   Consulted and Agree with Plan of Care Patient      Patient will benefit from skilled therapeutic intervention in order to improve the following deficits and impairments:  Hypomobility, Decreased scar mobility, Pain, Decreased strength, Decreased range of motion, Impaired flexibility, Postural dysfunction, Improper body mechanics  Visit Diagnosis: Pain in right shoulder  Muscle weakness (generalized)  Stiffness of right shoulder, not elsewhere classified       G-Codes - 08/07/16 1512    Functional Assessment Tool Used QuickDASH/ clinical impression/ pain/ joint stiffness/ muscle weakness   Functional Limitation Carrying, moving and handling objects   Carrying, Moving and Handling Objects Current Status [phone]) At least 20 percent but less than 40 percent impaired, limited or restricted   Carrying, Moving and Handling Objects Goal Status (P1121) At least 20 percent but less than 40 percent impaired, limited or restricted   Carrying, Moving and Handling Objects Discharge Status [phone]) At least 20 percent but less than 40 percent impaired, limited or restricted      Problem List Patient Active Problem List   Diagnosis Date Noted  . 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 S/p reverse total shoulder arthroplasty 04/06/2016   Pura Spice, PT, DPT # [phone] Derrill Memo, SPT 07-Aug-2016, 3:13 PM   [facility] [address] [place], [place], [address] Phone: [phone]   Fax:  [phone]  Name: DEOLINDA FRID MRN: [id number] Date of Birth: 1931-11-13

## 2016-12-16 ENCOUNTER — Other Ambulatory Visit: Payer: Self-pay | Admitting: Family Medicine

## 2016-12-16 DIAGNOSIS — Z1231 Encounter for screening mammogram for malignant neoplasm of breast: Secondary | ICD-10-CM

## 2017-01-31 ENCOUNTER — Ambulatory Visit
Admission: RE | Admit: 2017-01-31 | Discharge: 2017-01-31 | Disposition: A | Discharge status: Home / Self Care | Payer: Medicare Other | Source: Ambulatory Visit | Attending: Family Medicine | Admitting: Family Medicine

## 2017-01-31 DIAGNOSIS — Z1231 Encounter for screening mammogram for malignant neoplasm of breast: Secondary | ICD-10-CM | POA: Diagnosis not present

## 2017-10-14 IMAGING — DX DG SHOULDER 2+V PORT*R*
2 series · 2 of 2 positions shown · non-contrast
Comparison: None.

CLINICAL DATA: Status post total shoulder replacement

EXAM:
PORTABLE RIGHT SHOULDER - 2+ VIEW

[shoulder ap]
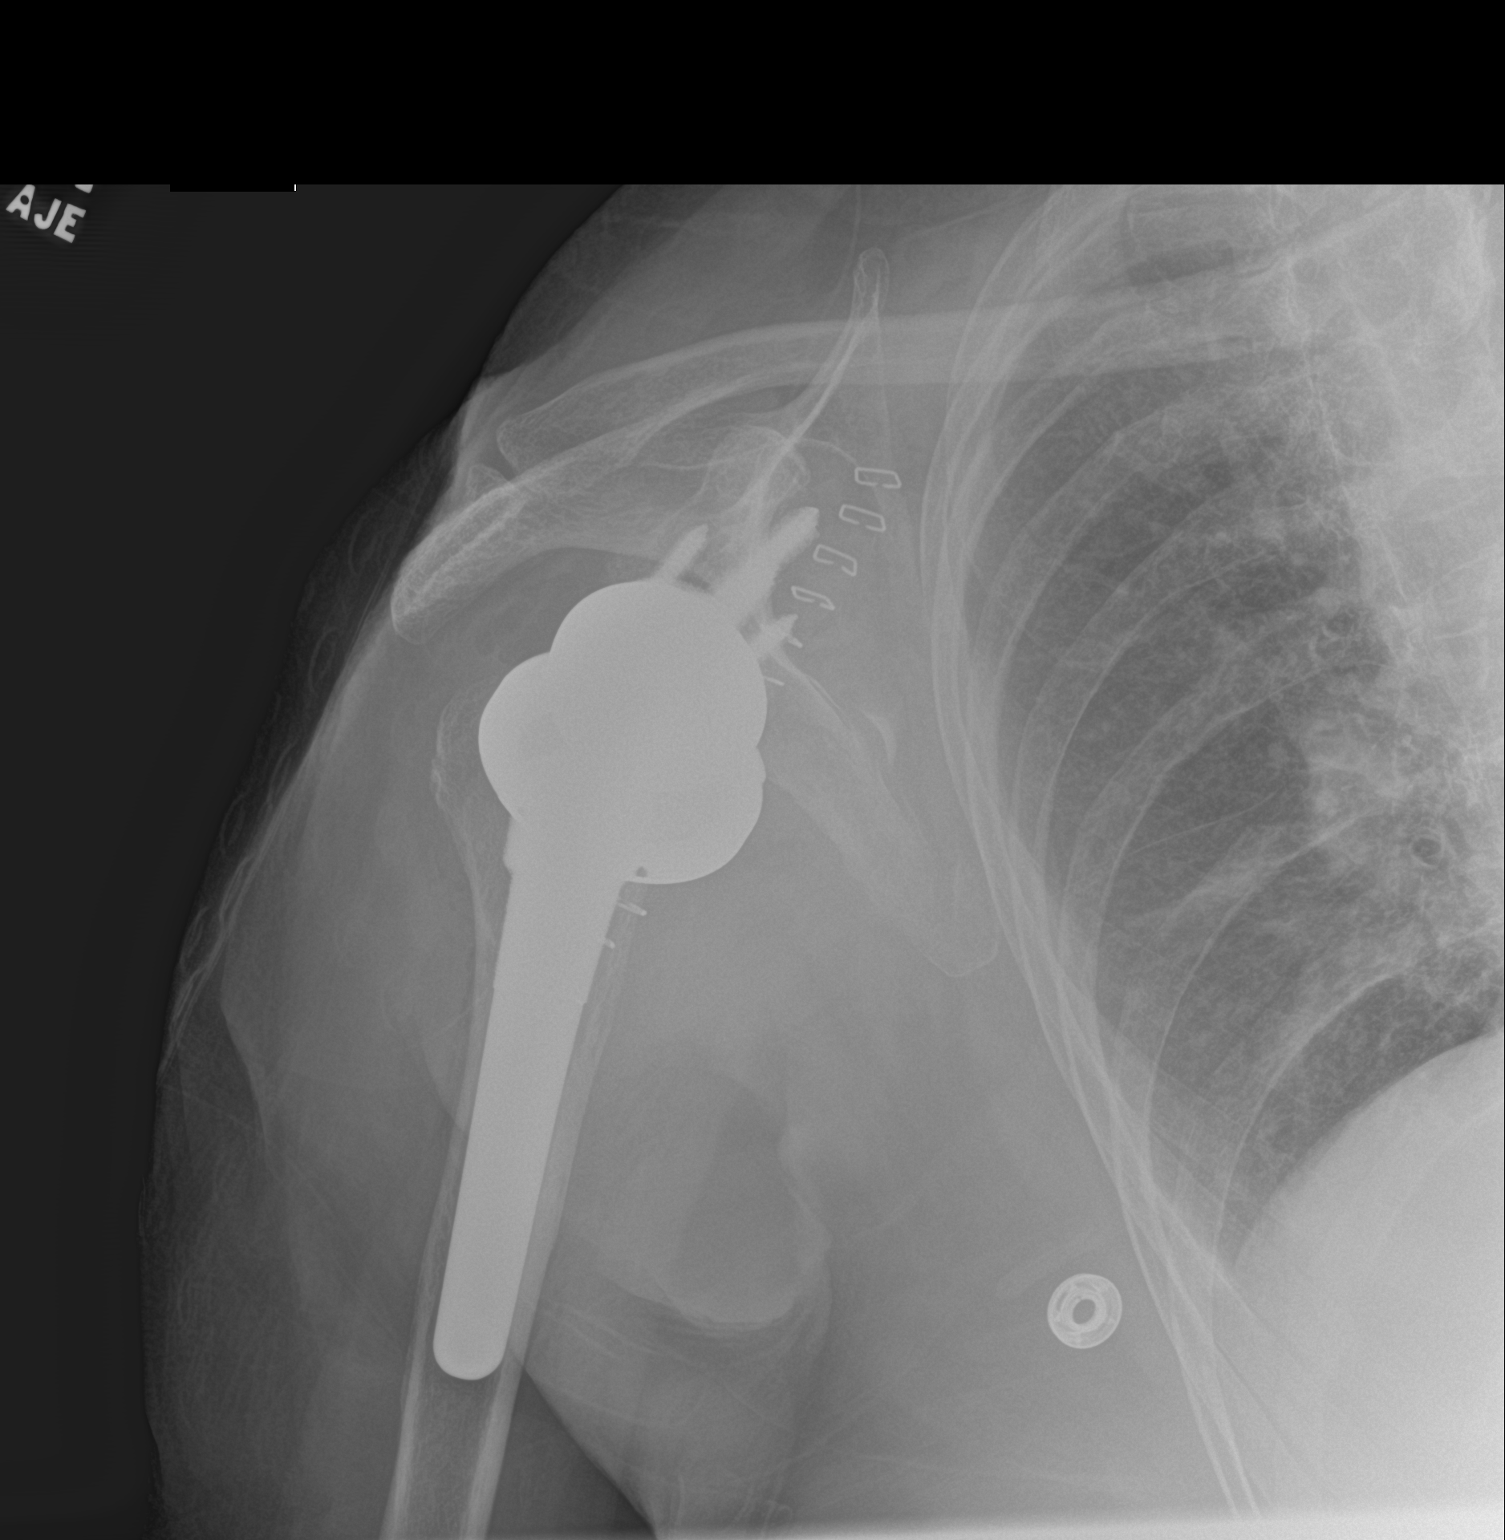

[shoulder obl]
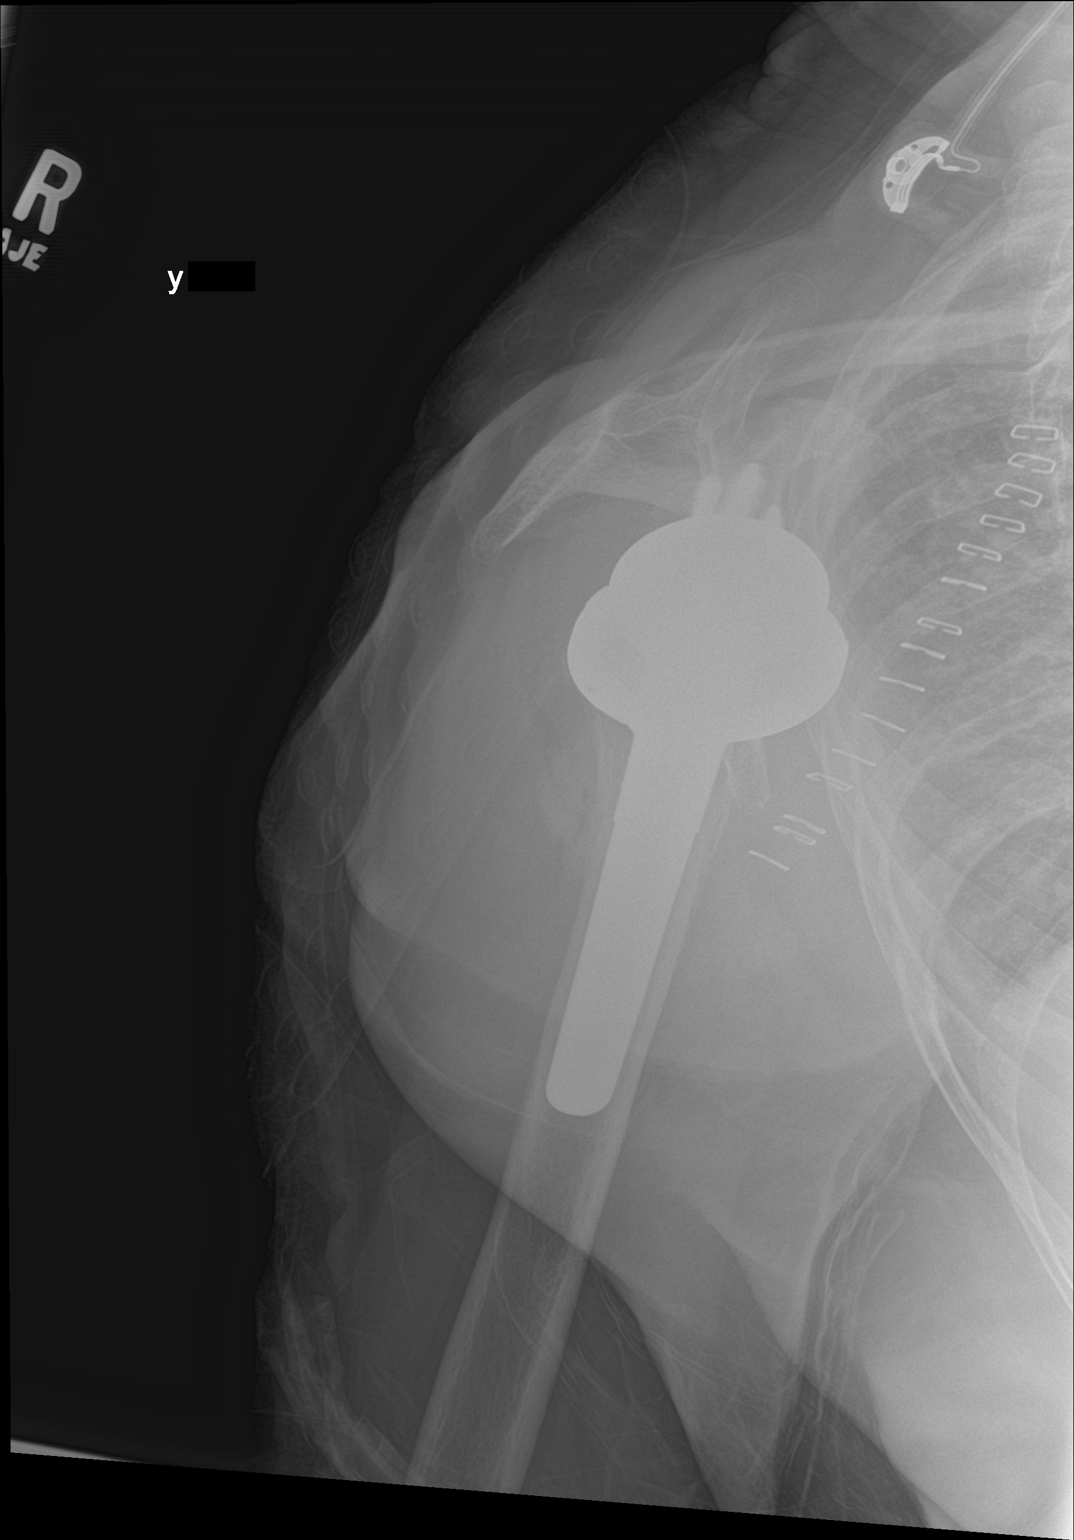

[2 of 2 positions shown; findings below may reference images not displayed]

FINDINGS: Attempted frontal and Y scapular images obtained. There is a total
shoulder replacement on the right with prosthetic components
appearing well-seated. No fracture or dislocation. Visualized right
lung clear.
IMPRESSION: Prosthetic components appear well seated. No acute fracture or
dislocation.

## 2017-12-23 ENCOUNTER — Other Ambulatory Visit: Payer: Self-pay | Admitting: Family Medicine

## 2017-12-23 DIAGNOSIS — Z1231 Encounter for screening mammogram for malignant neoplasm of breast: Secondary | ICD-10-CM

## 2018-02-01 ENCOUNTER — Ambulatory Visit
Admission: RE | Admit: 2018-02-01 | Discharge: 2018-02-01 | Disposition: A | Discharge status: Home / Self Care | Payer: Medicare Other | Source: Ambulatory Visit | Attending: Family Medicine | Admitting: Family Medicine

## 2018-02-01 DIAGNOSIS — Z1231 Encounter for screening mammogram for malignant neoplasm of breast: Secondary | ICD-10-CM | POA: Insufficient documentation

## 2018-09-26 ENCOUNTER — Encounter: Payer: Self-pay | Admitting: Physical Therapy

## 2018-09-26 ENCOUNTER — Ambulatory Visit: Payer: Medicare Other | Attending: Family Medicine | Admitting: Physical Therapy

## 2018-09-26 DIAGNOSIS — M6281 Muscle weakness (generalized): Secondary | ICD-10-CM | POA: Diagnosis present

## 2018-09-26 DIAGNOSIS — M25552 Pain in left hip: Secondary | ICD-10-CM | POA: Diagnosis present

## 2018-09-26 DIAGNOSIS — R269 Unspecified abnormalities of gait and mobility: Secondary | ICD-10-CM | POA: Diagnosis present

## 2018-09-26 NOTE — Patient Instructions (Signed)
Step 1  Step 2  Seated Piriformis Stretch reps: 3  sets: 1  hold: 20  daily: 2  weekly: 7 Setup  Begin sitting upright in a chair. Cross one leg over the other so that your ankle is resting on top of your opposite thigh. Movement  Gently pull your bent knee across your body toward your opposite shoulder. You should feel a stretch through the back of your hip and buttocks.  Tip  Try to not to arch your back or lean to one side as you stretch. Step 1  Step 2  Supine Piriformis Stretch with Foot on Ground reps: 3  sets: 1  hold: 20  daily: 2  weekly: 7 Setup  Begin by lying on your back with both knees bent and feet resting flat on the ground. Cross one leg over the other so your foot is resting on your knee.  Movement  Grab your leg just below the knee and slowly draw it towards your opposite shoulder until you feel a stretch in your buttocks.  Tip  Do not allow your back to twist or bend excessively during the stretch.  Step 1  Step 2  Standing March with Counter Support reps: 20  sets: 1  daily: 2  weekly: 7 Setup  Begin in a standing upright position with your hands resting on a counter. Movement  Slowly lift one knee to waist height, then lower it back down and repeat.  Tip  Make sure to maintain an upright posture and use the counter to help you balance as needed. Step 1  Step 2  Standing Hip Abduction with Counter Support reps: 20  sets: 1  daily: 2  weekly: 7 Setup  Begin in a standing upright position with your hands resting on a counter. Movement  Lift your leg out to your side, then return to the starting position and repeat.  Tip  Make sure to keep your moving leg straight and do not bend or rotate your trunk during the exercise. Use the counter to help you balance as needed. Step 1  Step 2  Standing Hip Extension with Counter Support reps: 20  sets: 1  daily: 2  weekly: 7 Setup  Begin in a standing upright position with your hands resting on a  counter. Movement  Tighten your buttock muscles and slowly lift your leg backward. Return to the starting position and repeat. Tip  Make sure to keep your moving leg straight and keep your shoulders and hips facing forward during the exercise. Use the counter to help you balance as needed.

## 2018-09-28 ENCOUNTER — Ambulatory Visit: Payer: Medicare Other | Admitting: Physical Therapy

## 2018-09-28 DIAGNOSIS — M25552 Pain in left hip: Secondary | ICD-10-CM

## 2018-09-28 DIAGNOSIS — M6281 Muscle weakness (generalized): Secondary | ICD-10-CM | POA: Diagnosis not present

## 2018-09-28 DIAGNOSIS — R269 Unspecified abnormalities of gait and mobility: Secondary | ICD-10-CM

## 2018-09-28 NOTE — Therapy (Signed)
[place] [facility] [address] [place], [place], [address] Phone: [phone]   Fax:  [phone]  Physical Therapy Evaluation  Patient Details  Name: Melanie Salazar MRN: [id number] Date of Birth: 11-19-1931 Referring Provider (PT): Dr. Ellison Hughs   Encounter Date: 09/26/2018  PT End of Session - 09/27/18 1836    Visit Number  1    Number of Visits  8    Date for PT Re-Evaluation  10/24/18    PT Start Time  0937    PT Stop Time  1042    PT Time Calculation (min)  65 min    Activity Tolerance  Patient tolerated treatment well    Behavior During Therapy  [facility] for tasks assessed/performed       Past Medical History:  Diagnosis Date  . Arthritis   . Colitis   . GERD (gastroesophageal reflux disease)   . Hypertension     Past Surgical History:  Procedure Laterality Date  . ABDOMINAL HYSTERECTOMY    . COLONOSCOPY N/A 03/27/2015   Procedure: COLONOSCOPY;  Surgeon: Hulen Luster, MD;  Location: [facility] ENDOSCOPY;  Service: Gastroenterology;  Laterality: N/A;  . EYE SURGERY Bilateral    Cataract Extraction with IOL  . FRACTURE SURGERY    . REVERSE SHOULDER ARTHROPLASTY Right 04/06/2016   Procedure: REVERSE SHOULDER ARTHROPLASTY;  Surgeon: Corky Mull, MD;  Location: [facility] ORS;  Service: Orthopedics;  Laterality: Right;  . SHOULDER ARTHROSCOPY W/ ROTATOR CUFF REPAIR Left   . WRIST FRACTURE SURGERY Left     There were no vitals filed for this visit.   Subjective Assessment - 09/27/18 1809    Subjective  Pt. is pleasant 84 y.o. female seeking therapy for L hip pain.  Pt. has noticed discomfort more frequently over the past several weeks, and notes that she has done research and feels as though it is piriformis related.  Pt. notes she is having some radicular pain going down the L LE.  Pt. has had difficulty with certain chores at home and would like to be able to return to prior level of function.    Limitations  House hold  activities;Writing;Lifting    How long can you sit comfortably?  <10 min    How long can you stand comfortably?  <10 min    Patient Stated Goals  Increase R shoulder AROM/ strength/ decrease pain.     Currently in Pain?  Yes    Pain Score  8   (Pended)     Pain Location  Buttocks  (Pended)     Pain Orientation  Left  (Pended)     Pain Descriptors / Indicators  Aching  (Pended)          [facility] PT Assessment - 09/28/18 0001      Assessment   Medical Diagnosis  Acute low back pain without sciatica, unspecified back pain laterality.     Referring Provider (PT)  Dr. Ellison Hughs    Onset Date/Surgical Date  02/20/18    Hand Dominance  Right    Prior Therapy  yes, known to PT clinic      Precautions   Precautions  None      Balance Screen   Has the patient fallen in the past 6 months  No    Has the patient had a decrease in activity level because of a fear of falling?   No    Is the patient reluctant to leave their home because of a  fear of falling?   No      Prior Function   Level of Independence  Independent      Cognition   Overall Cognitive Status  Within Functional Limits for tasks assessed      SUBJECTIVE  Chief complaint:  L Hip Pain Onset: Several Months Referring Dx:  L Hip Pain Aggravating factors: Prolonged standing Easing factors: Getting out of position Recent back trauma: No Prior history of back injury or pain: Yes Pain quality: pain quality: aching Radiating pain: Yes  Numbness/Tingling: No Follow-up appointment with MD: Yes Dominant hand: right Imaging: No    OBJECTIVE  Mental Status Patient is oriented to person, place and time.  Recent memory is intact.  Remote memory is intact.  Attention span and concentration are intact.  Expressive speech is intact.  Patient's fund of knowledge is within normal limits for educational level.  SENSATION: Grossly intact to light touch bilateral L as determined by testing dermatomes L2-S2 Proprioception and  hot/cold testing deferred on this date   MUSCULOSKELETAL: Tremor: None Bulk: Normal Tone: Normal  Posture Sits on edge of chair  Gait Slight antalgic gait   Palpation Tender in piriformis and ITB   Strength (out of 5) R/L 5/5 Hip flexion 5/5 Hip ER 5/5 Hip IR 5/5 Hip abduction 5/5 Hip adduction 5/5 Hip extension 5/5 Knee extension 5/5 Knee flexion 5/5 Ankle dorsiflexion *Indicates pain   AROM (degrees) R/L (all movements include overpressure unless otherwise stated) Lumbar forward flexion (0-65):  Lumbar extension (0-30): Lumbar lateral flexion (0-25): R:  L:  Lumbar rotation: Hip IR (0-45): R: L: Hip ER (0-45): R: L: Hip Flexion (0-125):  Hip Abduction (0-40): R: L: Hip extension (0-15): R: L: *Indicates pain  PROM (degrees) PROM = AROM R/L (all movements include overpressure unless otherwise stated) Lumbar forward flexion (65 degrees):  Lumbar extension (30 degrees): Lumbar lateral flexion (25 degrees): R: degrees L: degrees Lumbar rotation:   *Indicates pain  Repeated Movements No centralization or peripheralization of symptoms with repeated lumbar extension or flexion.      Passive Accessory Intervertebral Motion (PAIVM) Pt denies reproduction of posterior hip pain with CPA L1-L5 and UPA bilaterally L1-L5. Generally hypomobile throughout  Passive Physiological Intervertebral Motion (PPIVM) Normal flexion and extension with PPIVM testing   SPECIAL TESTS  Slump: R: Negative L: Negative     ASSESSMENT Clinical Impression: Pt is a pleasant year-old female/female referred for low back pain. PT examination reveals deficits . Pt presents with deficits in strength, mobility, range of motion, and pain. Pt will benefit from skilled PT services to address deficits and return to pain-free function at home and work.       Manual tx.:  Supine L hip/piriformis stretches 13 min.   See HEP.     PT Long Term Goals - 09/28/18 1452      PT LONG TERM GOAL  #1   Title  Pt. will increase FOTO to 51 to improve pain-free functional mobility.      Baseline  11/5: initial evaluation 34.      Time  4    Period  Weeks    Status  New    Target Date  10/24/18      PT LONG TERM GOAL #2   Title  Pt. will increase B hip strength 1/2 muscle grade to improve standing pattern/ normalized gait pattern.      Baseline  Pt. demonstrates B hip strength to be 3+/5, B hamstrings 4/5, B hip add  4+/5, hip abd 4/5.    Time  4    Period  Weeks    Status  New    Target Date  10/24/18      PT LONG TERM GOAL #3   Title  Pt. will report 2/10 L hip pain at worst with prolonged sitting/standing tasks to improve pain-free mobility.      Baseline  15/10 L hip pain at worst.  Pt. reports 8/10 L hip pain currently at rest.      Time  4    Period  Weeks    Status  New    Target Date  10/24/18      PT LONG TERM GOAL #4   Title  Pt. will ambulate with normalized gait pattern on all surfaces with no increase c/o L hip discomfort to improve functional mobility.      Baseline  Moderate L antalgic gait pattern with increase distance walked    Time  4    Period  Weeks    Status  New    Target Date  10/24/18        Plan - 09/28/18 0744    Clinical Impression Statement  Pt. is pleasant 84 y.o. female with pain and weakness in her L hip.  Pt. currently demonstrates antalgic gait in stance phase of the L LE.  Pt. demonstrates B hip strength to be 3+/5, B hamstrings 4/5, B hip add 4+/5, hip abd 4/5.  Pt. demonstrates negative slump test as well.  Pt. will benefit from skilled therapy to increase strength of B LEs and decrease pain during ambulation.    Clinical Presentation  Evolving    Clinical Decision Making  Moderate    Rehab Potential  Good    PT Frequency  2x / week    PT Duration  4 weeks    PT Treatment/Interventions  ADLs/Self Care Home Management;Cryotherapy;Electrical Stimulation;Therapeutic exercise;Therapeutic activities;Functional mobility training;Neuromuscular  re-education;Patient/family education;Manual techniques;Passive range of motion    PT Next Visit Plan  assess HEP compliance.    PT Home Exercise Plan  see handouts.    Consulted and Agree with Plan of Care  Patient       Patient will benefit from skilled therapeutic intervention in order to improve the following deficits and impairments:  Hypomobility, Decreased scar mobility, Pain, Decreased strength, Decreased range of motion, Impaired flexibility, Postural dysfunction, Improper body mechanics  Visit Diagnosis: Muscle weakness (generalized)  Pain in left hip  Gait difficulty     Problem List Patient Active Problem List   Diagnosis Date Noted  . 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 S/p reverse total shoulder arthroplasty 04/06/2016   Pura Spice, PT, DPT # [id number] Gwenlyn Saran, SPT 09/28/2018, 3:16 PM  [place] [facility] [address] [place], [place], [address] Phone: [phone]   Fax:  [phone]  Name: Melanie Salazar MRN: [id number] Date of Birth: [birth year]

## 2018-09-29 NOTE — Therapy (Addendum)
[place] [facility] [address] [place], [place], [address] Phone: [phone]   Fax:  [phone]  Physical Therapy Treatment  Patient Details  Name: Melanie Salazar MRN: [id number] Date of Birth: 28-Dec-1930 Referring Provider (PT): Dr. Ellison Hughs   Encounter Date: 09/28/2018  PT End of Session - 10/23/18 1644    Visit Number  2    Number of Visits  8    Date for PT Re-Evaluation  10/24/18    PT Start Time  1111    PT Stop Time  1204    PT Time Calculation (min)  53 min    Activity Tolerance  Patient tolerated treatment well;Patient limited by pain    Behavior During Therapy  [facility] for tasks assessed/performed       Past Medical History:  Diagnosis Date  . Arthritis   . Colitis   . GERD (gastroesophageal reflux disease)   . Hypertension     Past Surgical History:  Procedure Laterality Date  . ABDOMINAL HYSTERECTOMY    . COLONOSCOPY N/A 03/27/2015   Procedure: COLONOSCOPY;  Surgeon: Hulen Luster, MD;  Location: [facility] ENDOSCOPY;  Service: Gastroenterology;  Laterality: N/A;  . EYE SURGERY Bilateral    Cataract Extraction with IOL  . FRACTURE SURGERY    . REVERSE SHOULDER ARTHROPLASTY Right 04/06/2016   Procedure: REVERSE SHOULDER ARTHROPLASTY;  Surgeon: Corky Mull, MD;  Location: [facility] ORS;  Service: Orthopedics;  Laterality: Right;  . SHOULDER ARTHROSCOPY W/ ROTATOR CUFF REPAIR Left   . WRIST FRACTURE SURGERY Left     There were no vitals filed for this visit.  Subjective Assessment - 10/23/18 1641    Subjective  Pt. reports significant pain in L glut/hip into proximal hamstring with sitting posture/ riding in car.      Limitations  House hold activities;Writing;Lifting    How long can you sit comfortably?  <10 min    How long can you stand comfortably?  <10 min    Patient Stated Goals  Decrease L hip pain with sitting.      Currently in Pain?  Yes    Pain Score  8     Pain Location  Hip    Pain Orientation  Left    Pain  Descriptors / Indicators  Aching    Pain Type  Chronic pain         Treatment:  There.ex.:  Reviewed HEP/ TrA muscle activation in seated and supine posture.   Manual tx.:  Supine L/R piriformis/ prox. And distal hamstring/ trunk rotn./ ITB/ hip flexor stretches 4x 30 sec. Each. Supine/ R sidelying L hip and piriformis STM (manual release techniques to L piriformis region).  Discussed use of heat/ ice.   PT Long Term Goals - 09/28/18 1452      PT LONG TERM GOAL #1   Title  Pt. will increase FOTO to 51 to improve pain-free functional mobility.      Baseline  11/5: initial evaluation 34.      Time  4    Period  Weeks    Status  New    Target Date  10/24/18      PT LONG TERM GOAL #2   Title  Pt. will increase B hip strength 1/2 muscle grade to improve standing pattern/ normalized gait pattern.      Baseline  Pt. demonstrates B hip strength to be 3+/5, B hamstrings 4/5, B hip add 4+/5, hip abd 4/5.    Time  4    Period  Weeks    Status  New    Target Date  10/24/18      PT LONG TERM GOAL #3   Title  Pt. will report 2/10 L hip pain at worst with prolonged sitting/standing tasks to improve pain-free mobility.      Baseline  15/10 L hip pain at worst.  Pt. reports 8/10 L hip pain currently at rest.      Time  4    Period  Weeks    Status  New    Target Date  10/24/18      PT LONG TERM GOAL #4   Title  Pt. will ambulate with normalized gait pattern on all surfaces with no increase c/o L hip discomfort to improve functional mobility.      Baseline  Moderate L antalgic gait pattern with increase distance walked    Time  4    Period  Weeks    Status  New    Target Date  10/24/18       (+) L piriformis muscle discomfort/ tenderness with deep palpation.  Pt. is progressing well with hip/hamstring stretching with no c/o pain.  Pt. presents with good lumbar AROM all planes and unable to reproduce symptoms with standing/walking.     Plan - 10/23/18 1645    Clinical  Presentation  Evolving    Clinical Decision Making  Moderate    Rehab Potential  Good    PT Frequency  2x / week    PT Duration  4 weeks    PT Treatment/Interventions  ADLs/Self Care Home Management;Cryotherapy;Electrical Stimulation;Therapeutic exercise;Therapeutic activities;Functional mobility training;Neuromuscular re-education;Patient/family education;Manual techniques;Passive range of motion    PT Next Visit Plan  progress hip/core strengthening/ review L piriformis stretches.      PT Home Exercise Plan  see handouts.       Patient will benefit from skilled therapeutic intervention in order to improve the following deficits and impairments:  Hypomobility, Decreased scar mobility, Pain, Decreased strength, Decreased range of motion, Impaired flexibility, Postural dysfunction, Improper body mechanics  Visit Diagnosis: Pain in left hip  Muscle weakness (generalized)  Gait difficulty     Problem List Patient Active Problem List   Diagnosis Date Noted  . 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 S/p reverse total shoulder arthroplasty 04/06/2016   Pura Spice, PT, DPT # [phone] 10/23/2018, 4:50 PM  [place] [facility] [address] [place], [place], [address] Phone: [phone]   Fax:  [phone]  Name: Melanie Salazar MRN: [id number] Date of Birth: 06-18-1931

## 2018-10-03 ENCOUNTER — Encounter: Payer: Self-pay | Admitting: Physical Therapy

## 2018-10-03 ENCOUNTER — Ambulatory Visit: Payer: Medicare Other | Admitting: Physical Therapy

## 2018-10-03 DIAGNOSIS — R269 Unspecified abnormalities of gait and mobility: Secondary | ICD-10-CM

## 2018-10-03 DIAGNOSIS — M6281 Muscle weakness (generalized): Secondary | ICD-10-CM | POA: Diagnosis not present

## 2018-10-03 DIAGNOSIS — M25552 Pain in left hip: Secondary | ICD-10-CM

## 2018-10-05 ENCOUNTER — Ambulatory Visit: Payer: Medicare Other | Admitting: Physical Therapy

## 2018-10-08 NOTE — Therapy (Addendum)
[place] [facility] [address] [place], [place], [address] Phone: [phone]   Fax:  [phone]  Physical Therapy Treatment  Patient Details  Name: Melanie Salazar MRN: [id number] Date of Birth: Sep 29, 1931 Referring Provider (PT): Dr. Ellison Hughs   Encounter Date: 10/03/2018  PT End of Session - 10/23/18 1657    Visit Number  3    Number of Visits  8    Date for PT Re-Evaluation  10/24/18    PT Start Time  1424    PT Stop Time  1513    PT Time Calculation (min)  49 min    Activity Tolerance  Patient tolerated treatment well;Patient limited by pain    Behavior During Therapy  [facility] for tasks assessed/performed       Past Medical History:  Diagnosis Date  . Arthritis   . Colitis   . GERD (gastroesophageal reflux disease)   . Hypertension     Past Surgical History:  Procedure Laterality Date  . ABDOMINAL HYSTERECTOMY    . COLONOSCOPY N/A 03/27/2015   Procedure: COLONOSCOPY;  Surgeon: Hulen Luster, MD;  Location: [facility] ENDOSCOPY;  Service: Gastroenterology;  Laterality: N/A;  . EYE SURGERY Bilateral    Cataract Extraction with IOL  . FRACTURE SURGERY    . REVERSE SHOULDER ARTHROPLASTY Right 04/06/2016   Procedure: REVERSE SHOULDER ARTHROPLASTY;  Surgeon: Corky Mull, MD;  Location: [facility] ORS;  Service: Orthopedics;  Laterality: Right;  . SHOULDER ARTHROSCOPY W/ ROTATOR CUFF REPAIR Left   . WRIST FRACTURE SURGERY Left     There were no vitals filed for this visit.  Subjective Assessment - 10/23/18 1656    Subjective  Pt. reports no pain with standing in PT clinic but >8/10 L hip pain with sitting in car.      Limitations  House hold activities;Writing;Lifting    How long can you sit comfortably?  <10 min    Patient Stated Goals  Decrease L hip pain with sitting.      Currently in Pain?  No/denies         Treatment:  There.ex.:  Reviewed seated/ supine piriformis and prox. Hamstring stretches Supine TrA muscle activation:  marching/ bridging/ hip abd. 20x each   Manual tx.:  Supine L/R piriformis/ prox. And distal hamstring/ trunk rotn./ ITB/ hip flexor stretches 4x 30 sec. Each. Supine/ R sidelying L hip and piriformis STM (manual release techniques to L piriformis region). Tennis ball massage to L glut./ piriformis musculature.      PT Long Term Goals - 09/28/18 1452      PT LONG TERM GOAL #1   Title  Pt. will increase FOTO to 51 to improve pain-free functional mobility.      Baseline  11/5: initial evaluation 34.      Time  4    Period  Weeks    Status  New    Target Date  10/24/18      PT LONG TERM GOAL #2   Title  Pt. will increase B hip strength 1/2 muscle grade to improve standing pattern/ normalized gait pattern.      Baseline  Pt. demonstrates B hip strength to be 3+/5, B hamstrings 4/5, B hip add 4+/5, hip abd 4/5.    Time  4    Period  Weeks    Status  New    Target Date  10/24/18      PT LONG TERM GOAL #3   Title  Pt. will  report 2/10 L hip pain at worst with prolonged sitting/standing tasks to improve pain-free mobility.      Baseline  15/10 L hip pain at worst.  Pt. reports 8/10 L hip pain currently at rest.      Time  4    Period  Weeks    Status  New    Target Date  10/24/18      PT LONG TERM GOAL #4   Title  Pt. will ambulate with normalized gait pattern on all surfaces with no increase c/o L hip discomfort to improve functional mobility.      Baseline  Moderate L antalgic gait pattern with increase distance walked    Time  4    Period  Weeks    Status  New    Target Date  10/24/18         Plan - 10/23/18 1657    Clinical Impression Statement  Pt. progressing well with LE/ hip stretches in a pain tolerable range.  Pt. is only having pain symptoms in L hip/glut region with seated posture/ driving.  Pt. has tightness noted in L piriformis region with deep tissue release technique.  Pt. is not a good candidate for dry needling at this time.      Clinical Presentation   Evolving    Clinical Decision Making  Moderate    Rehab Potential  Good    PT Frequency  2x / week    PT Duration  4 weeks    PT Treatment/Interventions  ADLs/Self Care Home Management;Cryotherapy;Electrical Stimulation;Therapeutic exercise;Therapeutic activities;Functional mobility training;Neuromuscular re-education;Patient/family education;Manual techniques;Passive range of motion    PT Next Visit Plan  progress hip/core strengthening/ review L piriformis stretches.      PT Home Exercise Plan  see handouts.    Consulted and Agree with Plan of Care  Patient       Patient will benefit from skilled therapeutic intervention in order to improve the following deficits and impairments:  Hypomobility, Decreased scar mobility, Pain, Decreased strength, Decreased range of motion, Impaired flexibility, Postural dysfunction, Improper body mechanics  Visit Diagnosis: Pain in left hip  Muscle weakness (generalized)  Gait difficulty     Problem List Patient Active Problem List   Diagnosis Date Noted  . 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 S/p reverse total shoulder arthroplasty 04/06/2016   Pura Spice, PT, DPT # [phone] 10/23/2018, 5:01 PM  [place] [facility] [address] [place], [place], [address] Phone: [phone]   Fax:  [phone]  Name: Melanie Salazar MRN: [id number] Date of Birth: 1931/02/17

## 2018-10-10 ENCOUNTER — Encounter: Payer: Self-pay | Admitting: Physical Therapy

## 2018-10-10 ENCOUNTER — Ambulatory Visit: Payer: Medicare Other | Admitting: Physical Therapy

## 2018-10-10 DIAGNOSIS — M25552 Pain in left hip: Secondary | ICD-10-CM

## 2018-10-10 DIAGNOSIS — M6281 Muscle weakness (generalized): Secondary | ICD-10-CM

## 2018-10-10 DIAGNOSIS — R269 Unspecified abnormalities of gait and mobility: Secondary | ICD-10-CM

## 2018-10-12 ENCOUNTER — Ambulatory Visit: Payer: Medicare Other | Admitting: Physical Therapy

## 2018-10-12 DIAGNOSIS — M6281 Muscle weakness (generalized): Secondary | ICD-10-CM

## 2018-10-12 DIAGNOSIS — R269 Unspecified abnormalities of gait and mobility: Secondary | ICD-10-CM

## 2018-10-12 DIAGNOSIS — M25552 Pain in left hip: Secondary | ICD-10-CM

## 2018-10-13 NOTE — Therapy (Addendum)
[facility] [address] [place], [place], [address] Phone: [phone]   Fax:  [phone]  Physical Therapy Treatment  Patient Details  Name: Melanie Salazar MRN: [id number] Date of Birth: 12/12/1930 Referring Provider (PT): Dr. Ellison Hughs   Encounter Date: 10/10/2018  PT End of Session - 10/23/18 1708    Visit Number  4    Number of Visits  8    Date for PT Re-Evaluation  10/24/18    PT Start Time  1428    PT Stop Time  1522    PT Time Calculation (min)  54 min    Activity Tolerance  Patient tolerated treatment well;Patient limited by pain    Behavior During Therapy  [facility] for tasks assessed/performed       Past Medical History:  Diagnosis Date  . Arthritis   . Colitis   . GERD (gastroesophageal reflux disease)   . Hypertension     Past Surgical History:  Procedure Laterality Date  . ABDOMINAL HYSTERECTOMY    . COLONOSCOPY N/A 03/27/2015   Procedure: COLONOSCOPY;  Surgeon: Hulen Luster, MD;  Location: [facility] ENDOSCOPY;  Service: Gastroenterology;  Laterality: N/A;  . EYE SURGERY Bilateral    Cataract Extraction with IOL  . FRACTURE SURGERY    . REVERSE SHOULDER ARTHROPLASTY Right 04/06/2016   Procedure: REVERSE SHOULDER ARTHROPLASTY;  Surgeon: Corky Mull, MD;  Location: [facility] ORS;  Service: Orthopedics;  Laterality: Right;  . SHOULDER ARTHROSCOPY W/ ROTATOR CUFF REPAIR Left   . WRIST FRACTURE SURGERY Left     There were no vitals filed for this visit.  Subjective Assessment - 10/23/18 1706    Subjective  Pt. reports 3/10 L hip pain currently at rest.  Pt. states she has remained active since last PT tx. session.  Pt. reports no pain with driving to PT clinic today.      Limitations  House hold activities;Writing;Lifting    How long can you sit comfortably?  <10 min    Patient Stated Goals  Decrease L hip pain with sitting.      Currently in Pain?  Yes    Pain Score  3     Pain Location  Hip    Pain Orientation   Left    Pain Descriptors / Indicators  Aching    Pain Type  Chronic pain       Treatment:  Manual tx.:  Supine L/R hip and LE generalized stretches (17 min.). R sidelying L hip and piriformis STM (manual release techniques to L piriformis region).  There.ex.:  Supine GTB hip abduction/ marching 20x.  Supine hip adduction with ball 10 sec. Holds 10x.  Supine bridging and bicycles 10x2 each (cuing for TrA activation).    Ice to L hip in R sidelying position after tx. Session.     PT Long Term Goals - 09/28/18 1452      PT LONG TERM GOAL #1   Title  Pt. will increase FOTO to 51 to improve pain-free functional mobility.      Baseline  11/5: initial evaluation 34.      Time  4    Period  Weeks    Status  New    Target Date  10/24/18      PT LONG TERM GOAL #2   Title  Pt. will increase B hip strength 1/2 muscle grade to improve standing pattern/ normalized gait pattern.      Baseline  Pt. demonstrates B hip  strength to be 3+/5, B hamstrings 4/5, B hip add 4+/5, hip abd 4/5.    Time  4    Period  Weeks    Status  New    Target Date  10/24/18      PT LONG TERM GOAL #3   Title  Pt. will report 2/10 L hip pain at worst with prolonged sitting/standing tasks to improve pain-free mobility.      Baseline  15/10 L hip pain at worst.  Pt. reports 8/10 L hip pain currently at rest.      Time  4    Period  Weeks    Status  New    Target Date  10/24/18      PT LONG TERM GOAL #4   Title  Pt. will ambulate with normalized gait pattern on all surfaces with no increase c/o L hip discomfort to improve functional mobility.      Baseline  Moderate L antalgic gait pattern with increase distance walked    Time  4    Period  Weeks    Status  New    Target Date  10/24/18            Plan - 10/23/18 1708    Clinical Impression Statement  No increase c/o pain with addition of resisted/ GTB hip ex.  Pt. progressing well with strength training and no increase c/o pain in L hip.  PT  obtimistic that pt. had no pain in L hip while driving to PT clinic today.      Clinical Presentation  Evolving    Clinical Decision Making  Moderate    Rehab Potential  Good    PT Frequency  2x / week    PT Duration  4 weeks    PT Treatment/Interventions  ADLs/Self Care Home Management;Cryotherapy;Electrical Stimulation;Therapeutic exercise;Therapeutic activities;Functional mobility training;Neuromuscular re-education;Patient/family education;Manual techniques;Passive range of motion    PT Next Visit Plan  progress hip/core strengthening.    PT Home Exercise Plan  see handouts.       Patient will benefit from skilled therapeutic intervention in order to improve the following deficits and impairments:  Hypomobility, Decreased scar mobility, Pain, Decreased strength, Decreased range of motion, Impaired flexibility, Postural dysfunction, Improper body mechanics  Visit Diagnosis: Pain in left hip  Muscle weakness (generalized)  Gait difficulty     Problem List Patient Active Problem List   Diagnosis Date Noted  . 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 S/p reverse total shoulder arthroplasty 04/06/2016   Pura Spice, PT, DPT # [phone] 10/23/2018, 5:14 PM  [facility] [address] [place], [place], [address] Phone: [phone]   Fax:  [phone]  Name: Melanie Salazar MRN: [id number] Date of Birth: Feb 02, 1931

## 2018-10-14 NOTE — Therapy (Addendum)
[place] [address] [place], [place], [address] Phone: [phone]   Fax:  [phone]  Physical Therapy Treatment  Patient Details  Name: Melanie Salazar MRN: [id number] Date of Birth: Jun 30, 1931 Referring Provider (PT): Dr. Ellison Hughs   Encounter Date: 10/12/2018  PT End of Session - 10/23/18 1723    Visit Number  5    Number of Visits  8    Date for PT Re-Evaluation  10/24/18    PT Start Time  0175    PT Stop Time  1523    PT Time Calculation (min)  52 min    Activity Tolerance  Patient tolerated treatment well;Patient limited by pain    Behavior During Therapy  [facility] for tasks assessed/performed       Past Medical History:  Diagnosis Date  . Arthritis   . Colitis   . GERD (gastroesophageal reflux disease)   . Hypertension     Past Surgical History:  Procedure Laterality Date  . ABDOMINAL HYSTERECTOMY    . COLONOSCOPY N/A 03/27/2015   Procedure: COLONOSCOPY;  Surgeon: Hulen Luster, MD;  Location: [facility] ENDOSCOPY;  Service: Gastroenterology;  Laterality: N/A;  . EYE SURGERY Bilateral    Cataract Extraction with IOL  . FRACTURE SURGERY    . REVERSE SHOULDER ARTHROPLASTY Right 04/06/2016   Procedure: REVERSE SHOULDER ARTHROPLASTY;  Surgeon: Corky Mull, MD;  Location: [facility] ORS;  Service: Orthopedics;  Laterality: Right;  . SHOULDER ARTHROSCOPY W/ ROTATOR CUFF REPAIR Left   . WRIST FRACTURE SURGERY Left     There were no vitals filed for this visit.  Subjective Assessment - 10/23/18 1720    Subjective  Pt. states she is hurting bad today.  Pt. states she hasn't done anything different but sitting is really painful.      Limitations  House hold activities;Writing;Lifting    How long can you sit comfortably?  <10 min    Patient Stated Goals  Decrease L hip pain with sitting.      Currently in Pain?  Yes    Pain Score  8     Pain Location  Hip    Pain Orientation  Left    Pain Descriptors / Indicators  Aching    Pain Type  Chronic pain         Treatment:  There.ex.:  Standing hip ex. With GTB: flexion/ abd./ extension 25x each (cuing for upright posture with mirror feedback).   Monster walks with GTB in //-bars 5x. Sit to stands/ squat instruction in front of mirror (moderate verbal cuing).    Manual tx.:  Supine L/R piriformis/ prox. And distal hamstring/ trunk rotn./ ITB/ hip flexor stretches 4x 30 sec. Each. Supine/ R sidelying L hip and piriformis STM (manual release techniques to L piriformis region).     PT Long Term Goals - 09/28/18 1452      PT LONG TERM GOAL #1   Title  Pt. will increase FOTO to 51 to improve pain-free functional mobility.      Baseline  11/5: initial evaluation 34.      Time  4    Period  Weeks    Status  New    Target Date  10/24/18      PT LONG TERM GOAL #2   Title  Pt. will increase B hip strength 1/2 muscle grade to improve standing pattern/ normalized gait pattern.      Baseline  Pt. demonstrates B hip strength to be  3+/5, B hamstrings 4/5, B hip add 4+/5, hip abd 4/5.    Time  4    Period  Weeks    Status  New    Target Date  10/24/18      PT LONG TERM GOAL #3   Title  Pt. will report 2/10 L hip pain at worst with prolonged sitting/standing tasks to improve pain-free mobility.      Baseline  15/10 L hip pain at worst.  Pt. reports 8/10 L hip pain currently at rest.      Time  4    Period  Weeks    Status  New    Target Date  10/24/18      PT LONG TERM GOAL #4   Title  Pt. will ambulate with normalized gait pattern on all surfaces with no increase c/o L hip discomfort to improve functional mobility.      Baseline  Moderate L antalgic gait pattern with increase distance walked    Time  4    Period  Weeks    Status  New    Target Date  10/24/18         Plan - 10/23/18 1723    Clinical Impression Statement  Pt. continues to progress well with hip/ core strengthening resisted ex. with no increase c/o pain.  PT is able to reproduce pain  symptoms with deep palpation to L posterior hip/ piriformis musculature.  Good hamstring flexibility and neural glides noted today.  Pt. will continue with daily stretching and strengthening ex. 4-5x/week.      Clinical Presentation  Evolving    Clinical Decision Making  Moderate    Rehab Potential  Good    PT Frequency  2x / week    PT Duration  4 weeks    PT Treatment/Interventions  ADLs/Self Care Home Management;Cryotherapy;Electrical Stimulation;Therapeutic exercise;Therapeutic activities;Functional mobility training;Neuromuscular re-education;Patient/family education;Manual techniques;Passive range of motion    PT Next Visit Plan  Progress hip/core strengthening.  Discusss goals/ MD f/u??    PT Home Exercise Plan  see handouts.    Consulted and Agree with Plan of Care  Patient       Patient will benefit from skilled therapeutic intervention in order to improve the following deficits and impairments:  Hypomobility, Decreased scar mobility, Pain, Decreased strength, Decreased range of motion, Impaired flexibility, Postural dysfunction, Improper body mechanics  Visit Diagnosis: Pain in left hip  Muscle weakness (generalized)  Gait difficulty     Problem List Patient Active Problem List   Diagnosis Date Noted  . 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 S/p reverse total shoulder arthroplasty 04/06/2016   Pura Spice, PT, DPT # [phone] 10/23/2018, 5:28 PM  [place] [facility] [address] [place], [place], [address] Phone: [phone]   Fax:  [phone]  Name: Melanie Salazar MRN: [id number] Date of Birth: 29-Dec-1930

## 2018-10-17 ENCOUNTER — Ambulatory Visit: Payer: Medicare Other | Admitting: Physical Therapy

## 2018-10-17 DIAGNOSIS — M25552 Pain in left hip: Secondary | ICD-10-CM

## 2018-10-17 DIAGNOSIS — R269 Unspecified abnormalities of gait and mobility: Secondary | ICD-10-CM

## 2018-10-17 DIAGNOSIS — M6281 Muscle weakness (generalized): Secondary | ICD-10-CM

## 2018-10-20 ENCOUNTER — Encounter: Payer: Self-pay | Admitting: Physical Therapy

## 2018-10-20 NOTE — Therapy (Addendum)
[place] [facility] [address] [place], [place], [address] Phone: [phone]   Fax:  [phone]  Physical Therapy Treatment  Patient Details  Name: Melanie Salazar MRN: [id number] Date of Birth: October 17, 1931 Referring Provider (PT): Dr. Ellison Hughs   Encounter Date: 10/17/2018  PT End of Session - 10/24/18 0712    Visit Number  6    Number of Visits  8    Date for PT Re-Evaluation  10/24/18    PT Start Time  3299    PT Stop Time  1519    PT Time Calculation (min)  48 min    Activity Tolerance  Patient tolerated treatment well;Patient limited by pain    Behavior During Therapy  [facility] for tasks assessed/performed       Past Medical History:  Diagnosis Date  . Arthritis   . Colitis   . GERD (gastroesophageal reflux disease)   . Hypertension     Past Surgical History:  Procedure Laterality Date  . ABDOMINAL HYSTERECTOMY    . COLONOSCOPY N/A 03/27/2015   Procedure: COLONOSCOPY;  Surgeon: Hulen Luster, MD;  Location: [facility] ENDOSCOPY;  Service: Gastroenterology;  Laterality: N/A;  . EYE SURGERY Bilateral    Cataract Extraction with IOL  . FRACTURE SURGERY    . REVERSE SHOULDER ARTHROPLASTY Right 04/06/2016   Procedure: REVERSE SHOULDER ARTHROPLASTY;  Surgeon: Corky Mull, MD;  Location: [facility] ORS;  Service: Orthopedics;  Laterality: Right;  . SHOULDER ARTHROSCOPY W/ ROTATOR CUFF REPAIR Left   . WRIST FRACTURE SURGERY Left     There were no vitals filed for this visit.  Subjective Assessment - 10/23/18 1731    Subjective  Pt. remains pain limited with sitting and driving due to persistent L hip/gluteal pain.  Pt. states the pain is not too bad right now in standing posture.    Limitations  House hold activities;Writing;Lifting    How long can you sit comfortably?  <10 min    Patient Stated Goals  Decrease L hip pain with sitting.      Currently in Pain?  Yes    Pain Score  3     Pain Location  Hip    Pain Orientation  Left    Pain  Descriptors / Indicators  Aching    Pain Type  Chronic pain          Treatment:  Manual tx.: Supine L/R hip and LE generalized stretches (11 min.). R sidelying L hip and piriformis STM (manual release techniques to L piriformis/glut. region).  There.ex.:  Supine core ex.: bridging/ bridging with added marching/ marching 20x. Standing hip ex. With GTB/ Reviewed HEP     Ice to L hip in R sidelying position after tx. Session.      PT Long Term Goals - 09/28/18 1452      PT LONG TERM GOAL #1   Title  Pt. will increase FOTO to 51 to improve pain-free functional mobility.      Baseline  11/5: initial evaluation 34.      Time  4    Period  Weeks    Status  New    Target Date  10/24/18      PT LONG TERM GOAL #2   Title  Pt. will increase B hip strength 1/2 muscle grade to improve standing pattern/ normalized gait pattern.      Baseline  Pt. demonstrates B hip strength to be 3+/5, B hamstrings 4/5, B hip add 4+/5, hip  abd 4/5.    Time  4    Period  Weeks    Status  New    Target Date  10/24/18      PT LONG TERM GOAL #3   Title  Pt. will report 2/10 L hip pain at worst with prolonged sitting/standing tasks to improve pain-free mobility.      Baseline  15/10 L hip pain at worst.  Pt. reports 8/10 L hip pain currently at rest.      Time  4    Period  Weeks    Status  New    Target Date  10/24/18      PT LONG TERM GOAL #4   Title  Pt. will ambulate with normalized gait pattern on all surfaces with no increase c/o L hip discomfort to improve functional mobility.      Baseline  Moderate L antalgic gait pattern with increase distance walked    Time  4    Period  Weeks    Status  New    Target Date  10/24/18            Plan - 10/24/18 0713    Clinical Impression Statement  Pt. progressing and completing core stability/ hip ex. with no issues.  Good lumbar/ hamstring muscle length during manual tx. session.  Pt. appears to be doing well with standing/walking but pain  worsens with seated position.  Marked increase c/o pain with prolonged sitting tasks.  Pt. is unable to decrease pain symptoms with lumbar pillow/ wedge/ massage to L glut. in sitting.      Clinical Presentation  Evolving    Clinical Decision Making  Moderate    Rehab Potential  Good    PT Frequency  2x / week    PT Duration  4 weeks    PT Treatment/Interventions  ADLs/Self Care Home Management;Cryotherapy;Electrical Stimulation;Therapeutic exercise;Therapeutic activities;Functional mobility training;Neuromuscular re-education;Patient/family education;Manual techniques;Passive range of motion    PT Next Visit Plan  Progress hip/core strengthening.  Discusss goals/ MD f/u??       Patient will benefit from skilled therapeutic intervention in order to improve the following deficits and impairments:  Hypomobility, Decreased scar mobility, Pain, Decreased strength, Decreased range of motion, Impaired flexibility, Postural dysfunction, Improper body mechanics  Visit Diagnosis: Pain in left hip  Muscle weakness (generalized)  Gait difficulty     Problem List Patient Active Problem List   Diagnosis Date Noted  . 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 S/p reverse total shoulder arthroplasty 04/06/2016   Pura Spice, PT, DPT # [phone] 10/24/2018, 7:16 AM  [place] [facility] [address] [place], [place], [address] Phone: [phone]   Fax:  [phone]  Name: Melanie Salazar MRN: [id number] Date of Birth: 12-30-1930

## 2018-10-23 ENCOUNTER — Ambulatory Visit: Payer: Medicare Other | Attending: Family Medicine | Admitting: Physical Therapy

## 2018-10-23 DIAGNOSIS — M6281 Muscle weakness (generalized): Secondary | ICD-10-CM | POA: Insufficient documentation

## 2018-10-23 DIAGNOSIS — R269 Unspecified abnormalities of gait and mobility: Secondary | ICD-10-CM | POA: Insufficient documentation

## 2018-10-23 DIAGNOSIS — M25552 Pain in left hip: Secondary | ICD-10-CM | POA: Insufficient documentation

## 2018-10-24 ENCOUNTER — Encounter: Payer: Self-pay | Admitting: Physical Therapy

## 2018-10-24 NOTE — Therapy (Signed)
[place] [facility] [address] [place], [place], [address] Phone: [phone]   Fax:  [phone]  Physical Therapy Treatment  Patient Details  Name: Melanie Salazar MRN: [id number] Date of Birth: 11/12/1931 Referring Provider (PT): Dr. Ellison Hughs   Encounter Date: 10/23/2018  PT End of Session - 10/24/18 0720    Visit Number  7    Number of Visits  8    Date for PT Re-Evaluation  10/24/18    PT Start Time  0939    PT Stop Time  1028    PT Time Calculation (min)  49 min    Activity Tolerance  Patient tolerated treatment well;Patient limited by pain    Behavior During Therapy  [facility] for tasks assessed/performed       Past Medical History:  Diagnosis Date  . Arthritis   . Colitis   . GERD (gastroesophageal reflux disease)   . Hypertension     Past Surgical History:  Procedure Laterality Date  . ABDOMINAL HYSTERECTOMY    . COLONOSCOPY N/A 03/27/2015   Procedure: COLONOSCOPY;  Surgeon: Hulen Luster, MD;  Location: [facility] ENDOSCOPY;  Service: Gastroenterology;  Laterality: N/A;  . EYE SURGERY Bilateral    Cataract Extraction with IOL  . FRACTURE SURGERY    . REVERSE SHOULDER ARTHROPLASTY Right 04/06/2016   Procedure: REVERSE SHOULDER ARTHROPLASTY;  Surgeon: Corky Mull, MD;  Location: [facility] ORS;  Service: Orthopedics;  Laterality: Right;  . SHOULDER ARTHROSCOPY W/ ROTATOR CUFF REPAIR Left   . WRIST FRACTURE SURGERY Left     There were no vitals filed for this visit.  Subjective Assessment - 10/24/18 0718    Subjective  Pt. states she continues to have persistent L hip/gluteal pain with sitting and driving.  Pt. entered PT with normalized gait pattern and states she is tired of the persistent hip/glut. pain on L side.  Pt. reports compliance with HEP and stays active on a daily basis.     Limitations  House hold activities;Writing;Lifting    How long can you sit comfortably?  <10 min    Patient Stated Goals  Decrease L hip pain with  sitting.      Currently in Pain?  Yes    Pain Score  8     Pain Location  Hip    Pain Orientation  Left    Pain Descriptors / Indicators  Aching    Pain Type  Chronic pain         Treatment:  Manual tx.: Supine L/R hip and LE generalized stretches (20 min.). R sidelying L hip and piriformis STM (manual release techniques to L piriformis region).  There.ex.:  Supine bridging/ hip abduction 20x.  R sidelying L hip clamshells/ hip abduction with knee ext. 20x.  Reviewed HEP and gait pattern.    Pt. Referred back to MD to discuss persistent L hip pain, esp. With sitting.  Pts. Remains tender in L piriformis with manual tx..      PT Long Term Goals - 10/24/18 0724      PT LONG TERM GOAL #1   Title  Pt. will increase FOTO to 51 to improve pain-free functional mobility.      Baseline  11/5: initial evaluation 34.      Time  4    Period  Weeks    Status  On-going    Target Date  10/24/18      PT LONG TERM GOAL #2   Title  Pt.  will increase B hip strength 1/2 muscle grade to improve standing pattern/ normalized gait pattern.      Baseline  Pt. demonstrates B hip strength to be 4/5, B hamstrings 4+/5, B hip add 4+/5, hip abd 4/5.    Time  4    Period  Weeks    Status  Partially Met    Target Date  10/24/18      PT LONG TERM GOAL #3   Title  Pt. will report 2/10 L hip pain at worst with prolonged sitting/standing tasks to improve pain-free mobility.      Baseline  Pt. reports 8/10 L hip pain currently at rest.      Time  4    Period  Weeks    Status  Not Met    Target Date  10/24/18      PT LONG TERM GOAL #4   Title  Pt. will ambulate with normalized gait pattern on all surfaces with no increase c/o L hip discomfort to improve functional mobility.      Baseline  More normalized gait pattern but pain in L hip persists    Time  4    Period  Weeks    Status  Not Met    Target Date  10/24/18         Plan - 10/24/18 0720    Clinical Impression Statement  Pt. has  worked hard with skilled PT to increase lumbar/LE muscle flexibility and progress core and hip stability over past month.  Pt. has continued pain in L hip with sitting and driving in car.  PT is unable to reproduce pain in L hip with lumbar assessment but (+) L piriformis/ glut. med. tenderness with deep palpation.  PT recommends pt. return to MD to discuss POC due to limited progress with conservative treatments/ previous cortisone injection.  Pt. will continue with daily stretching/ strengthening ex. program and will contact PT after MD f/u.      Clinical Presentation  Evolving    Clinical Decision Making  Moderate    Rehab Potential  Good    PT Frequency  2x / week    PT Duration  4 weeks    PT Treatment/Interventions  ADLs/Self Care Home Management;Cryotherapy;Electrical Stimulation;Therapeutic exercise;Therapeutic activities;Functional mobility training;Neuromuscular re-education;Patient/family education;Manual techniques;Passive range of motion    PT Next Visit Plan  Discusss MD f/u??       Patient will benefit from skilled therapeutic intervention in order to improve the following deficits and impairments:  Hypomobility, Decreased scar mobility, Pain, Decreased strength, Decreased range of motion, Impaired flexibility, Postural dysfunction, Improper body mechanics  Visit Diagnosis: Pain in left hip  Muscle weakness (generalized)  Gait difficulty     Problem List Patient Active Problem List   Diagnosis Date Noted  . 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 S/p reverse total shoulder arthroplasty 04/06/2016   Pura Spice, PT, DPT # [phone] 10/24/2018, 7:26 AM  [place] [facility] [address] [place], [place], [address] Phone: [phone]   Fax:  [phone]  Name: Melanie Salazar MRN: [id number] Date of Birth: 03-19-1931

## 2018-11-03 ENCOUNTER — Other Ambulatory Visit: Payer: Self-pay | Admitting: Physical Medicine and Rehabilitation

## 2018-11-03 DIAGNOSIS — M5416 Radiculopathy, lumbar region: Secondary | ICD-10-CM

## 2018-11-23 ENCOUNTER — Ambulatory Visit: Payer: Medicare Other

## 2018-11-28 ENCOUNTER — Encounter (INDEPENDENT_AMBULATORY_CARE_PROVIDER_SITE_OTHER): Payer: Self-pay

## 2018-11-28 ENCOUNTER — Ambulatory Visit
Admission: RE | Admit: 2018-11-28 | Discharge: 2018-11-28 | Disposition: A | Discharge status: Home / Self Care | Payer: Medicare Other | Source: Ambulatory Visit | Attending: Physical Medicine and Rehabilitation | Admitting: Physical Medicine and Rehabilitation

## 2018-11-28 DIAGNOSIS — M5416 Radiculopathy, lumbar region: Secondary | ICD-10-CM

## 2018-12-29 ENCOUNTER — Other Ambulatory Visit: Payer: Self-pay | Admitting: Family Medicine

## 2018-12-29 DIAGNOSIS — Z1231 Encounter for screening mammogram for malignant neoplasm of breast: Secondary | ICD-10-CM

## 2019-02-06 ENCOUNTER — Other Ambulatory Visit: Payer: Self-pay

## 2019-02-06 ENCOUNTER — Ambulatory Visit
Admission: RE | Admit: 2019-02-06 | Discharge: 2019-02-06 | Disposition: A | Discharge status: Home / Self Care | Payer: Medicare Other | Source: Ambulatory Visit | Attending: Family Medicine | Admitting: Family Medicine

## 2019-02-06 DIAGNOSIS — Z1231 Encounter for screening mammogram for malignant neoplasm of breast: Secondary | ICD-10-CM | POA: Insufficient documentation

## 2020-01-04 ENCOUNTER — Other Ambulatory Visit: Payer: Self-pay | Admitting: Family Medicine

## 2020-01-04 DIAGNOSIS — Z1231 Encounter for screening mammogram for malignant neoplasm of breast: Secondary | ICD-10-CM

## 2020-02-12 ENCOUNTER — Other Ambulatory Visit: Payer: Self-pay

## 2020-02-12 ENCOUNTER — Ambulatory Visit
Admission: RE | Admit: 2020-02-12 | Discharge: 2020-02-12 | Disposition: A | Discharge status: Home / Self Care | Payer: Medicare PPO | Source: Ambulatory Visit | Attending: Family Medicine | Admitting: Family Medicine

## 2020-02-12 DIAGNOSIS — Z1231 Encounter for screening mammogram for malignant neoplasm of breast: Secondary | ICD-10-CM | POA: Insufficient documentation

## 2021-01-09 ENCOUNTER — Other Ambulatory Visit: Payer: Self-pay | Admitting: Family Medicine

## 2021-01-09 DIAGNOSIS — Z1231 Encounter for screening mammogram for malignant neoplasm of breast: Secondary | ICD-10-CM

## 2021-02-16 ENCOUNTER — Ambulatory Visit
Admission: RE | Admit: 2021-02-16 | Discharge: 2021-02-16 | Disposition: A | Discharge status: Home / Self Care | Payer: Medicare PPO | Source: Ambulatory Visit | Attending: Family Medicine | Admitting: Family Medicine

## 2021-02-16 ENCOUNTER — Other Ambulatory Visit: Payer: Self-pay

## 2021-02-16 DIAGNOSIS — Z1231 Encounter for screening mammogram for malignant neoplasm of breast: Secondary | ICD-10-CM | POA: Diagnosis present

## 2022-01-18 ENCOUNTER — Other Ambulatory Visit: Payer: Self-pay | Admitting: Family Medicine

## 2022-01-18 DIAGNOSIS — Z1231 Encounter for screening mammogram for malignant neoplasm of breast: Secondary | ICD-10-CM

## 2022-02-17 ENCOUNTER — Other Ambulatory Visit: Payer: Self-pay

## 2022-02-17 ENCOUNTER — Ambulatory Visit
Admission: RE | Admit: 2022-02-17 | Discharge: 2022-02-17 | Disposition: A | Discharge status: Home / Self Care | Payer: Medicare PPO | Source: Ambulatory Visit | Attending: Family Medicine | Admitting: Family Medicine

## 2022-02-17 DIAGNOSIS — Z1231 Encounter for screening mammogram for malignant neoplasm of breast: Secondary | ICD-10-CM | POA: Insufficient documentation

## 2022-07-06 DIAGNOSIS — Z4689 Encounter for fitting and adjustment of other specified devices: Secondary | ICD-10-CM | POA: Diagnosis not present

## 2022-07-06 DIAGNOSIS — N8111 Cystocele, midline: Secondary | ICD-10-CM | POA: Diagnosis not present

## 2022-07-06 DIAGNOSIS — N816 Rectocele: Secondary | ICD-10-CM | POA: Diagnosis not present

## 2022-07-21 DIAGNOSIS — J301 Allergic rhinitis due to pollen: Secondary | ICD-10-CM | POA: Diagnosis not present

## 2022-07-21 DIAGNOSIS — K219 Gastro-esophageal reflux disease without esophagitis: Secondary | ICD-10-CM | POA: Diagnosis not present

## 2022-07-21 DIAGNOSIS — Z1389 Encounter for screening for other disorder: Secondary | ICD-10-CM | POA: Diagnosis not present

## 2022-07-21 DIAGNOSIS — E7800 Pure hypercholesterolemia, unspecified: Secondary | ICD-10-CM | POA: Diagnosis not present

## 2022-07-21 DIAGNOSIS — Z0000 Encounter for general adult medical examination without abnormal findings: Secondary | ICD-10-CM | POA: Diagnosis not present

## 2022-07-21 DIAGNOSIS — F5101 Primary insomnia: Secondary | ICD-10-CM | POA: Diagnosis not present

## 2022-07-21 DIAGNOSIS — I10 Essential (primary) hypertension: Secondary | ICD-10-CM | POA: Diagnosis not present

## 2022-07-22 DIAGNOSIS — E7800 Pure hypercholesterolemia, unspecified: Secondary | ICD-10-CM | POA: Diagnosis not present

## 2022-10-12 DIAGNOSIS — N898 Other specified noninflammatory disorders of vagina: Secondary | ICD-10-CM | POA: Diagnosis not present

## 2022-10-12 DIAGNOSIS — Z4689 Encounter for fitting and adjustment of other specified devices: Secondary | ICD-10-CM | POA: Diagnosis not present

## 2022-10-12 DIAGNOSIS — Z20822 Contact with and (suspected) exposure to covid-19: Secondary | ICD-10-CM | POA: Diagnosis not present

## 2022-10-13 DIAGNOSIS — Z20822 Contact with and (suspected) exposure to covid-19: Secondary | ICD-10-CM | POA: Diagnosis not present

## 2022-12-10 DIAGNOSIS — L570 Actinic keratosis: Secondary | ICD-10-CM | POA: Diagnosis not present

## 2022-12-10 DIAGNOSIS — X32XXXA Exposure to sunlight, initial encounter: Secondary | ICD-10-CM | POA: Diagnosis not present

## 2022-12-10 DIAGNOSIS — Z08 Encounter for follow-up examination after completed treatment for malignant neoplasm: Secondary | ICD-10-CM | POA: Diagnosis not present

## 2022-12-10 DIAGNOSIS — L739 Follicular disorder, unspecified: Secondary | ICD-10-CM | POA: Diagnosis not present

## 2022-12-10 DIAGNOSIS — Z85828 Personal history of other malignant neoplasm of skin: Secondary | ICD-10-CM | POA: Diagnosis not present

## 2022-12-10 DIAGNOSIS — D485 Neoplasm of uncertain behavior of skin: Secondary | ICD-10-CM | POA: Diagnosis not present

## 2022-12-13 DIAGNOSIS — K219 Gastro-esophageal reflux disease without esophagitis: Secondary | ICD-10-CM | POA: Diagnosis not present

## 2022-12-13 DIAGNOSIS — I10 Essential (primary) hypertension: Secondary | ICD-10-CM | POA: Diagnosis not present

## 2022-12-13 DIAGNOSIS — E7800 Pure hypercholesterolemia, unspecified: Secondary | ICD-10-CM | POA: Diagnosis not present

## 2023-01-11 DIAGNOSIS — Z4689 Encounter for fitting and adjustment of other specified devices: Secondary | ICD-10-CM | POA: Diagnosis not present

## 2023-01-11 DIAGNOSIS — N816 Rectocele: Secondary | ICD-10-CM | POA: Diagnosis not present

## 2023-01-11 DIAGNOSIS — N898 Other specified noninflammatory disorders of vagina: Secondary | ICD-10-CM | POA: Diagnosis not present

## 2023-01-11 DIAGNOSIS — N8111 Cystocele, midline: Secondary | ICD-10-CM | POA: Diagnosis not present

## 2023-01-12 DIAGNOSIS — Z20822 Contact with and (suspected) exposure to covid-19: Secondary | ICD-10-CM | POA: Diagnosis not present

## 2023-01-13 DIAGNOSIS — Z20822 Contact with and (suspected) exposure to covid-19: Secondary | ICD-10-CM | POA: Diagnosis not present

## 2023-01-14 DIAGNOSIS — Z20822 Contact with and (suspected) exposure to covid-19: Secondary | ICD-10-CM | POA: Diagnosis not present

## 2023-01-17 DIAGNOSIS — Z20822 Contact with and (suspected) exposure to covid-19: Secondary | ICD-10-CM | POA: Diagnosis not present

## 2023-01-18 ENCOUNTER — Other Ambulatory Visit: Payer: Self-pay | Admitting: Family Medicine

## 2023-01-18 DIAGNOSIS — Z1231 Encounter for screening mammogram for malignant neoplasm of breast: Secondary | ICD-10-CM

## 2023-01-20 DIAGNOSIS — Z20822 Contact with and (suspected) exposure to covid-19: Secondary | ICD-10-CM | POA: Diagnosis not present

## 2023-01-25 DIAGNOSIS — Z20822 Contact with and (suspected) exposure to covid-19: Secondary | ICD-10-CM | POA: Diagnosis not present

## 2023-01-27 DIAGNOSIS — Z20822 Contact with and (suspected) exposure to covid-19: Secondary | ICD-10-CM | POA: Diagnosis not present

## 2023-02-09 DIAGNOSIS — K219 Gastro-esophageal reflux disease without esophagitis: Secondary | ICD-10-CM | POA: Diagnosis not present

## 2023-02-09 DIAGNOSIS — F5101 Primary insomnia: Secondary | ICD-10-CM | POA: Diagnosis not present

## 2023-02-09 DIAGNOSIS — J301 Allergic rhinitis due to pollen: Secondary | ICD-10-CM | POA: Diagnosis not present

## 2023-02-09 DIAGNOSIS — I10 Essential (primary) hypertension: Secondary | ICD-10-CM | POA: Diagnosis not present

## 2023-02-09 DIAGNOSIS — E7800 Pure hypercholesterolemia, unspecified: Secondary | ICD-10-CM | POA: Diagnosis not present

## 2023-02-21 ENCOUNTER — Ambulatory Visit
Admission: RE | Admit: 2023-02-21 | Discharge: 2023-02-21 | Disposition: A | Discharge status: Home / Self Care | Payer: Medicare PPO | Source: Ambulatory Visit | Attending: Family Medicine | Admitting: Family Medicine

## 2023-02-21 DIAGNOSIS — Z1231 Encounter for screening mammogram for malignant neoplasm of breast: Secondary | ICD-10-CM | POA: Diagnosis not present

## 2023-02-22 DIAGNOSIS — Z85828 Personal history of other malignant neoplasm of skin: Secondary | ICD-10-CM | POA: Diagnosis not present

## 2023-02-22 DIAGNOSIS — L570 Actinic keratosis: Secondary | ICD-10-CM | POA: Diagnosis not present

## 2023-02-22 DIAGNOSIS — L820 Inflamed seborrheic keratosis: Secondary | ICD-10-CM | POA: Diagnosis not present

## 2023-02-22 DIAGNOSIS — D225 Melanocytic nevi of trunk: Secondary | ICD-10-CM | POA: Diagnosis not present

## 2023-02-22 DIAGNOSIS — D2261 Melanocytic nevi of right upper limb, including shoulder: Secondary | ICD-10-CM | POA: Diagnosis not present

## 2023-02-22 DIAGNOSIS — D2272 Melanocytic nevi of left lower limb, including hip: Secondary | ICD-10-CM | POA: Diagnosis not present

## 2023-02-22 DIAGNOSIS — L538 Other specified erythematous conditions: Secondary | ICD-10-CM | POA: Diagnosis not present

## 2023-02-22 DIAGNOSIS — D2262 Melanocytic nevi of left upper limb, including shoulder: Secondary | ICD-10-CM | POA: Diagnosis not present

## 2023-03-21 DIAGNOSIS — H353131 Nonexudative age-related macular degeneration, bilateral, early dry stage: Secondary | ICD-10-CM | POA: Diagnosis not present

## 2023-03-21 DIAGNOSIS — H04123 Dry eye syndrome of bilateral lacrimal glands: Secondary | ICD-10-CM | POA: Diagnosis not present

## 2023-03-21 DIAGNOSIS — H26491 Other secondary cataract, right eye: Secondary | ICD-10-CM | POA: Diagnosis not present

## 2023-07-12 DIAGNOSIS — Z4689 Encounter for fitting and adjustment of other specified devices: Secondary | ICD-10-CM | POA: Diagnosis not present

## 2023-07-12 DIAGNOSIS — N8111 Cystocele, midline: Secondary | ICD-10-CM | POA: Diagnosis not present

## 2023-07-12 DIAGNOSIS — N898 Other specified noninflammatory disorders of vagina: Secondary | ICD-10-CM | POA: Diagnosis not present

## 2023-07-12 DIAGNOSIS — N816 Rectocele: Secondary | ICD-10-CM | POA: Diagnosis not present

## 2023-07-15 DIAGNOSIS — E7800 Pure hypercholesterolemia, unspecified: Secondary | ICD-10-CM | POA: Diagnosis not present

## 2023-07-15 DIAGNOSIS — I10 Essential (primary) hypertension: Secondary | ICD-10-CM | POA: Diagnosis not present

## 2023-08-16 DIAGNOSIS — T07XXXA Unspecified multiple injuries, initial encounter: Secondary | ICD-10-CM | POA: Diagnosis not present

## 2023-08-16 DIAGNOSIS — M545 Low back pain, unspecified: Secondary | ICD-10-CM | POA: Diagnosis not present

## 2023-08-16 DIAGNOSIS — M5136 Other intervertebral disc degeneration, lumbar region: Secondary | ICD-10-CM | POA: Diagnosis not present

## 2023-08-16 DIAGNOSIS — W109XXA Fall (on) (from) unspecified stairs and steps, initial encounter: Secondary | ICD-10-CM | POA: Diagnosis not present

## 2023-08-16 DIAGNOSIS — R29890 Loss of height: Secondary | ICD-10-CM | POA: Diagnosis not present

## 2023-08-16 DIAGNOSIS — M4854XA Collapsed vertebra, not elsewhere classified, thoracic region, initial encounter for fracture: Secondary | ICD-10-CM | POA: Diagnosis not present

## 2023-08-24 DIAGNOSIS — S22080D Wedge compression fracture of T11-T12 vertebra, subsequent encounter for fracture with routine healing: Secondary | ICD-10-CM | POA: Diagnosis not present

## 2023-08-24 DIAGNOSIS — Z0000 Encounter for general adult medical examination without abnormal findings: Secondary | ICD-10-CM | POA: Diagnosis not present

## 2023-08-24 DIAGNOSIS — F5101 Primary insomnia: Secondary | ICD-10-CM | POA: Diagnosis not present

## 2023-08-24 DIAGNOSIS — K219 Gastro-esophageal reflux disease without esophagitis: Secondary | ICD-10-CM | POA: Diagnosis not present

## 2023-08-24 DIAGNOSIS — Z1331 Encounter for screening for depression: Secondary | ICD-10-CM | POA: Diagnosis not present

## 2023-08-24 DIAGNOSIS — J301 Allergic rhinitis due to pollen: Secondary | ICD-10-CM | POA: Diagnosis not present

## 2023-08-24 DIAGNOSIS — I10 Essential (primary) hypertension: Secondary | ICD-10-CM | POA: Diagnosis not present

## 2023-08-24 DIAGNOSIS — E7800 Pure hypercholesterolemia, unspecified: Secondary | ICD-10-CM | POA: Diagnosis not present

## 2023-08-27 IMAGING — MG MM DIGITAL SCREENING BILAT W/ TOMO AND CAD
8 series · 8 of 24 positions shown · non-contrast
Comparison: Previous exam(s).

CLINICAL DATA: Screening.

EXAM:
DIGITAL SCREENING BILATERAL MAMMOGRAM WITH TOMOSYNTHESIS AND CAD
TECHNIQUE: Bilateral screening digital craniocaudal and mediolateral oblique
mammograms were obtained. Bilateral screening digital breast
tomosynthesis was performed. The images were evaluated with
computer-aided detection.

[L CC synth-2D]
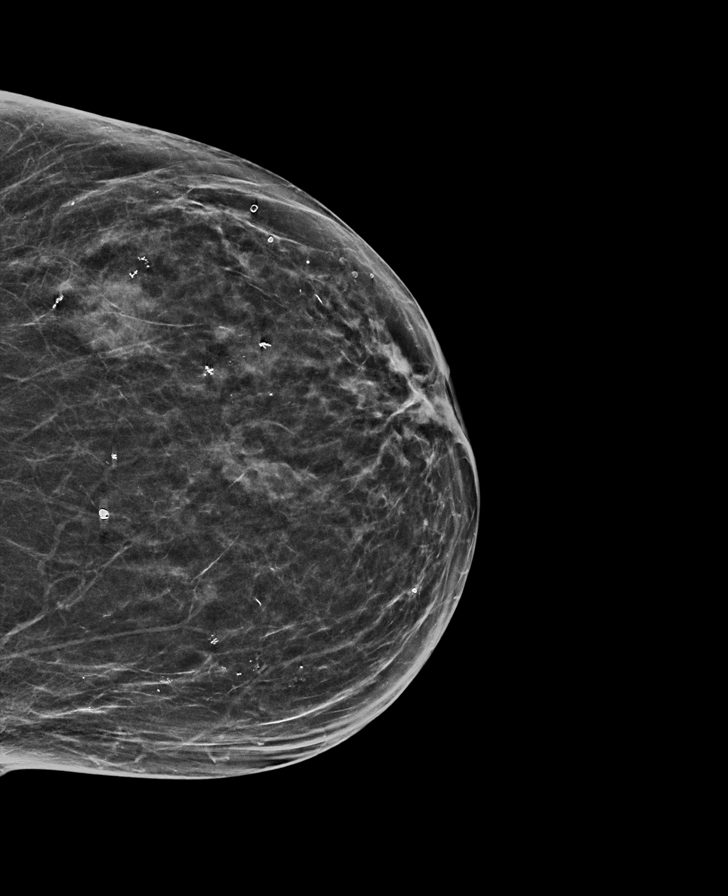

[R MLO synth-2D]
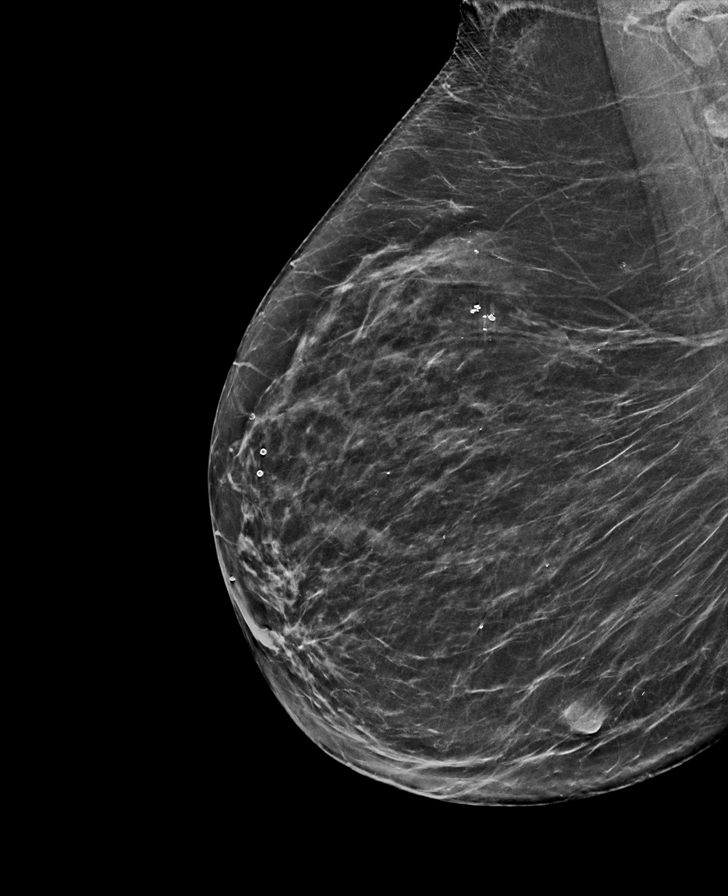

[R CC synth-2D]
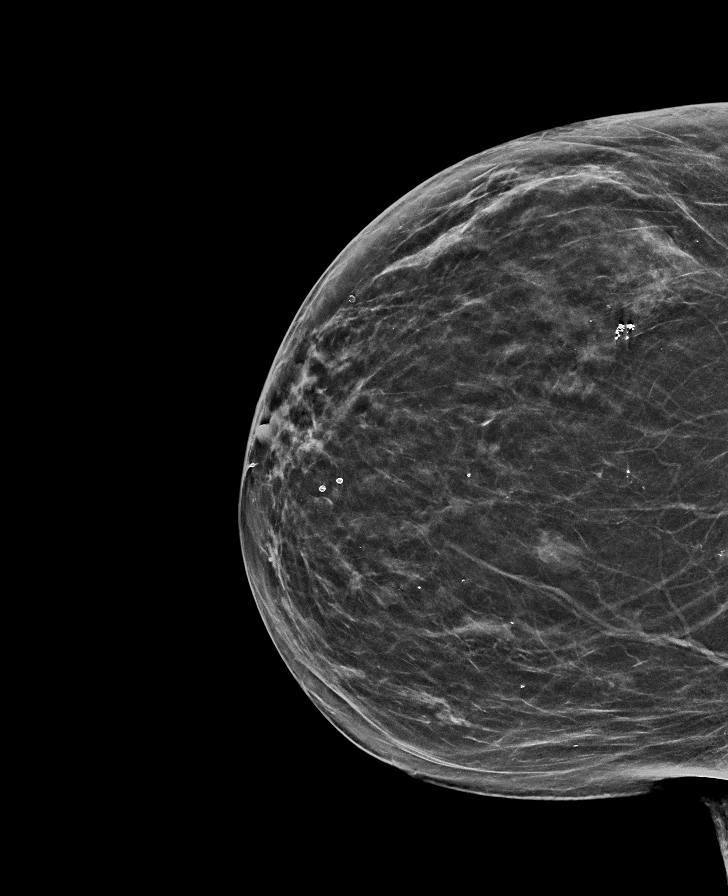

[L MLO synth-2D]
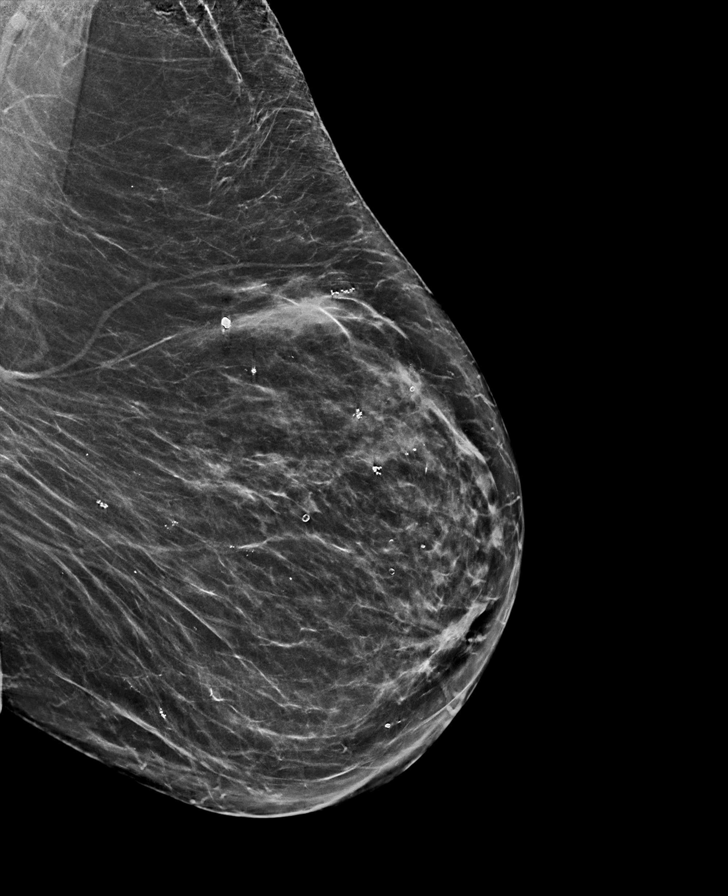

[L MLO tomo · tomo slice 35/68.0]
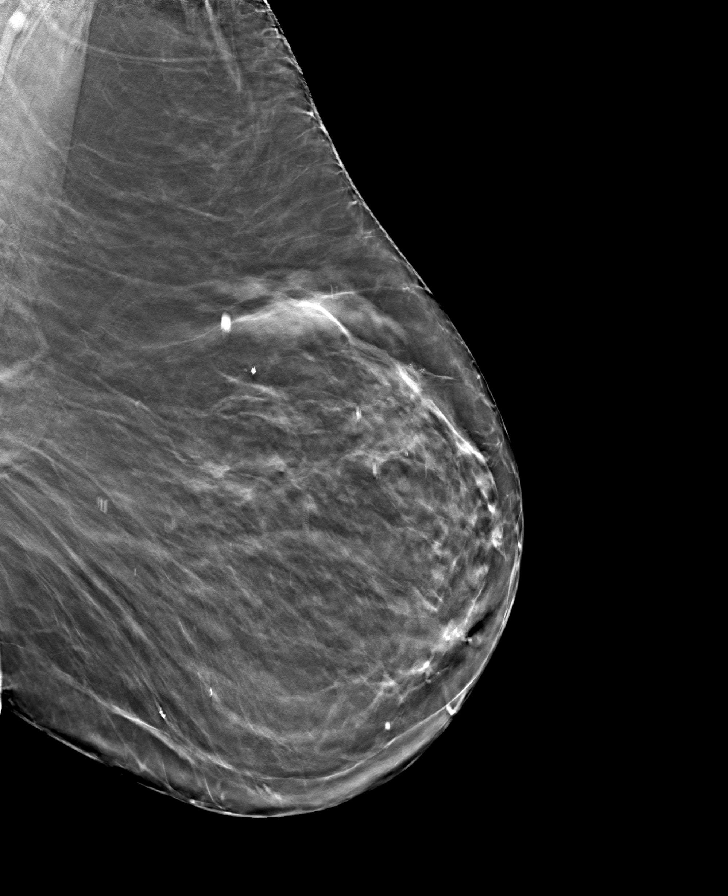

[L CC tomo · tomo slice 31/60.0]
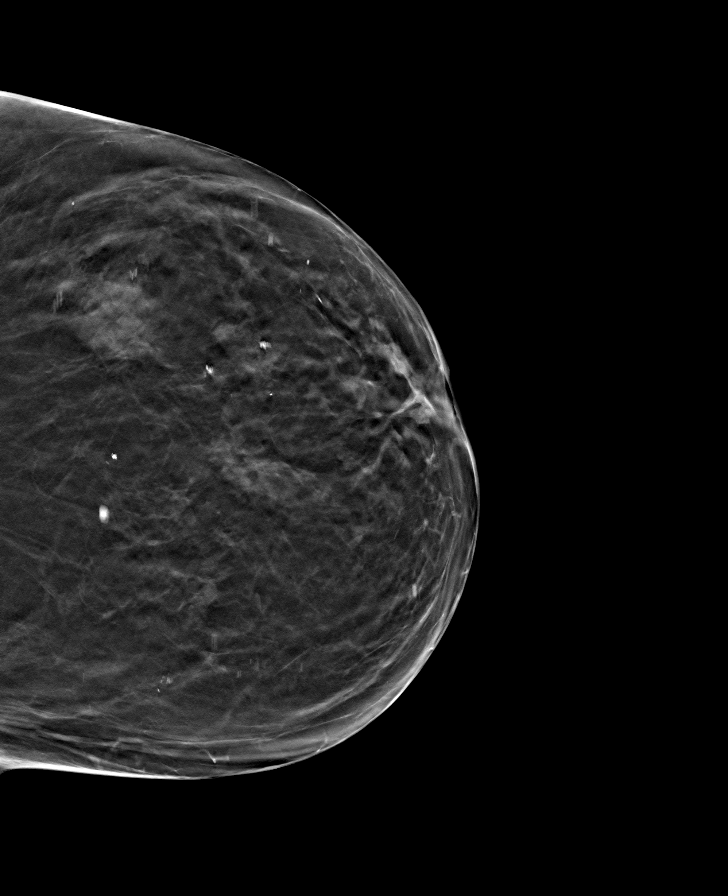

[R CC tomo · tomo slice 31/61.0]
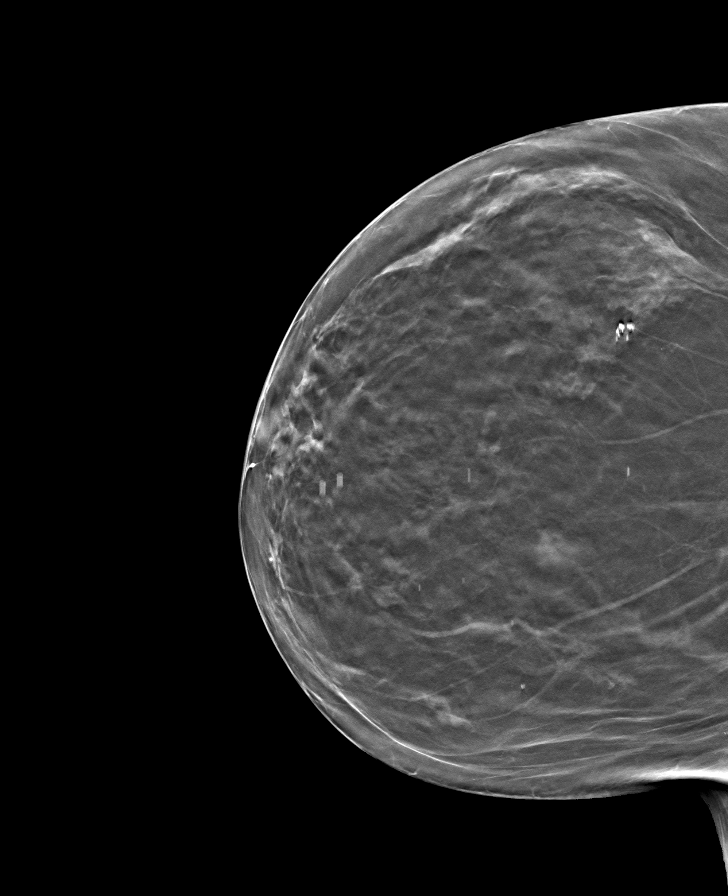

[R MLO tomo · tomo slice 33/66.0]
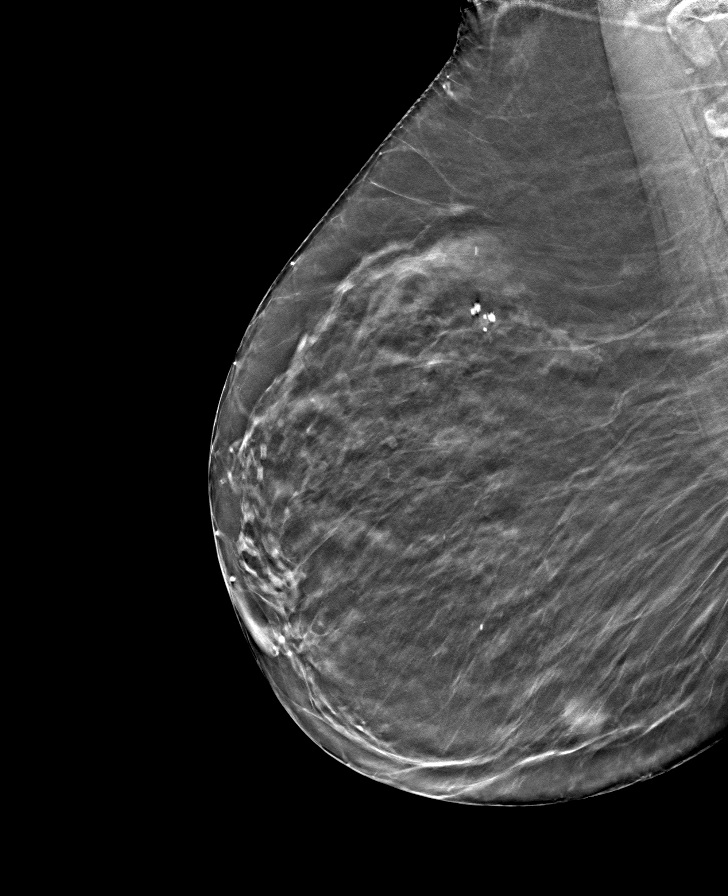

[8 of 24 positions shown; findings below may reference images not displayed]

ACR Breast Density Category b: There are scattered areas of
fibroglandular density.
FINDINGS: There are no findings suspicious for malignancy.
IMPRESSION: No mammographic evidence of malignancy. A result letter of this
screening mammogram will be mailed directly to the patient.

RECOMMENDATION:
Screening mammogram in one year. (Code:51-O-LD2)

BI-RADS CATEGORY  1: Negative.

## 2023-08-31 DIAGNOSIS — M5416 Radiculopathy, lumbar region: Secondary | ICD-10-CM | POA: Diagnosis not present

## 2023-08-31 DIAGNOSIS — M48062 Spinal stenosis, lumbar region with neurogenic claudication: Secondary | ICD-10-CM | POA: Diagnosis not present

## 2023-08-31 DIAGNOSIS — M5136 Other intervertebral disc degeneration, lumbar region with discogenic back pain only: Secondary | ICD-10-CM | POA: Diagnosis not present

## 2023-09-06 DIAGNOSIS — M48062 Spinal stenosis, lumbar region with neurogenic claudication: Secondary | ICD-10-CM | POA: Diagnosis not present

## 2023-09-06 DIAGNOSIS — M5416 Radiculopathy, lumbar region: Secondary | ICD-10-CM | POA: Diagnosis not present

## 2023-09-22 DIAGNOSIS — E7800 Pure hypercholesterolemia, unspecified: Secondary | ICD-10-CM | POA: Diagnosis not present

## 2023-09-22 DIAGNOSIS — I10 Essential (primary) hypertension: Secondary | ICD-10-CM | POA: Diagnosis not present

## 2023-09-27 DIAGNOSIS — M5416 Radiculopathy, lumbar region: Secondary | ICD-10-CM | POA: Diagnosis not present

## 2023-09-27 DIAGNOSIS — M48062 Spinal stenosis, lumbar region with neurogenic claudication: Secondary | ICD-10-CM | POA: Diagnosis not present

## 2023-10-06 DIAGNOSIS — M5416 Radiculopathy, lumbar region: Secondary | ICD-10-CM | POA: Diagnosis not present

## 2023-10-06 DIAGNOSIS — M48062 Spinal stenosis, lumbar region with neurogenic claudication: Secondary | ICD-10-CM | POA: Diagnosis not present

## 2023-10-31 DIAGNOSIS — E7800 Pure hypercholesterolemia, unspecified: Secondary | ICD-10-CM | POA: Diagnosis not present

## 2023-10-31 DIAGNOSIS — I10 Essential (primary) hypertension: Secondary | ICD-10-CM | POA: Diagnosis not present

## 2023-10-31 DIAGNOSIS — K219 Gastro-esophageal reflux disease without esophagitis: Secondary | ICD-10-CM | POA: Diagnosis not present

## 2023-11-29 DIAGNOSIS — N8111 Cystocele, midline: Secondary | ICD-10-CM | POA: Diagnosis not present

## 2023-11-29 DIAGNOSIS — Z4689 Encounter for fitting and adjustment of other specified devices: Secondary | ICD-10-CM | POA: Diagnosis not present

## 2023-11-29 DIAGNOSIS — N816 Rectocele: Secondary | ICD-10-CM | POA: Diagnosis not present

## 2023-12-30 DIAGNOSIS — I10 Essential (primary) hypertension: Secondary | ICD-10-CM | POA: Diagnosis not present

## 2023-12-30 DIAGNOSIS — E7800 Pure hypercholesterolemia, unspecified: Secondary | ICD-10-CM | POA: Diagnosis not present

## 2024-01-23 ENCOUNTER — Other Ambulatory Visit: Payer: Self-pay | Admitting: Family Medicine

## 2024-01-23 DIAGNOSIS — Z1231 Encounter for screening mammogram for malignant neoplasm of breast: Secondary | ICD-10-CM

## 2024-02-22 ENCOUNTER — Ambulatory Visit
Admission: RE | Admit: 2024-02-22 | Discharge: 2024-02-22 | Disposition: A | Discharge status: Home / Self Care | Source: Ambulatory Visit | Attending: Family Medicine | Admitting: Family Medicine

## 2024-02-22 DIAGNOSIS — Z1231 Encounter for screening mammogram for malignant neoplasm of breast: Secondary | ICD-10-CM | POA: Insufficient documentation

## 2024-02-28 DIAGNOSIS — K219 Gastro-esophageal reflux disease without esophagitis: Secondary | ICD-10-CM | POA: Diagnosis not present

## 2024-02-28 DIAGNOSIS — J301 Allergic rhinitis due to pollen: Secondary | ICD-10-CM | POA: Diagnosis not present

## 2024-02-28 DIAGNOSIS — E7800 Pure hypercholesterolemia, unspecified: Secondary | ICD-10-CM | POA: Diagnosis not present

## 2024-02-28 DIAGNOSIS — F5101 Primary insomnia: Secondary | ICD-10-CM | POA: Diagnosis not present

## 2024-02-28 DIAGNOSIS — I10 Essential (primary) hypertension: Secondary | ICD-10-CM | POA: Diagnosis not present

## 2024-03-02 DIAGNOSIS — L565 Disseminated superficial actinic porokeratosis (DSAP): Secondary | ICD-10-CM | POA: Diagnosis not present

## 2024-03-02 DIAGNOSIS — Z85828 Personal history of other malignant neoplasm of skin: Secondary | ICD-10-CM | POA: Diagnosis not present

## 2024-03-02 DIAGNOSIS — L570 Actinic keratosis: Secondary | ICD-10-CM | POA: Diagnosis not present

## 2024-03-02 DIAGNOSIS — D2261 Melanocytic nevi of right upper limb, including shoulder: Secondary | ICD-10-CM | POA: Diagnosis not present

## 2024-03-02 DIAGNOSIS — D225 Melanocytic nevi of trunk: Secondary | ICD-10-CM | POA: Diagnosis not present

## 2024-03-02 DIAGNOSIS — D2262 Melanocytic nevi of left upper limb, including shoulder: Secondary | ICD-10-CM | POA: Diagnosis not present

## 2024-03-02 DIAGNOSIS — D2272 Melanocytic nevi of left lower limb, including hip: Secondary | ICD-10-CM | POA: Diagnosis not present

## 2024-04-02 DIAGNOSIS — I10 Essential (primary) hypertension: Secondary | ICD-10-CM | POA: Diagnosis not present

## 2024-04-10 DIAGNOSIS — Z4689 Encounter for fitting and adjustment of other specified devices: Secondary | ICD-10-CM | POA: Diagnosis not present

## 2024-04-10 DIAGNOSIS — N898 Other specified noninflammatory disorders of vagina: Secondary | ICD-10-CM | POA: Diagnosis not present

## 2024-04-10 DIAGNOSIS — N8111 Cystocele, midline: Secondary | ICD-10-CM | POA: Diagnosis not present

## 2024-04-10 DIAGNOSIS — N816 Rectocele: Secondary | ICD-10-CM | POA: Diagnosis not present

## 2024-04-17 ENCOUNTER — Encounter: Payer: Self-pay | Admitting: Family Medicine

## 2024-04-24 ENCOUNTER — Inpatient Hospital Stay

## 2024-04-24 ENCOUNTER — Inpatient Hospital Stay: Attending: Internal Medicine | Admitting: Internal Medicine

## 2024-04-24 ENCOUNTER — Encounter: Payer: Self-pay | Admitting: Internal Medicine

## 2024-04-24 VITALS — BP 154/73 | HR 70 | Temp 97.7°F | Resp 24 | Ht 62.0 in | Wt 116.0 lb

## 2024-04-24 DIAGNOSIS — D75839 Thrombocytosis, unspecified: Secondary | ICD-10-CM | POA: Diagnosis not present

## 2024-04-24 DIAGNOSIS — D471 Chronic myeloproliferative disease: Secondary | ICD-10-CM | POA: Diagnosis not present

## 2024-04-24 DIAGNOSIS — Z7982 Long term (current) use of aspirin: Secondary | ICD-10-CM | POA: Insufficient documentation

## 2024-04-24 DIAGNOSIS — Z79899 Other long term (current) drug therapy: Secondary | ICD-10-CM | POA: Insufficient documentation

## 2024-04-24 LAB — COMPREHENSIVE METABOLIC PANEL WITH GFR
ALT: 19 U/L (ref 0–44)
AST: 24 U/L (ref 15–41)
Albumin: 4.5 g/dL (ref 3.5–5.0)
Alkaline Phosphatase: 81 U/L (ref 38–126)
Anion gap: 7 (ref 5–15)
BUN: 14 mg/dL (ref 8–23)
CO2: 30 mmol/L (ref 22–32)
Calcium: 9.4 mg/dL (ref 8.9–10.3)
Chloride: 101 mmol/L (ref 98–111)
Creatinine, Ser: 0.49 mg/dL (ref 0.44–1.00)
GFR, Estimated: >60 mL/min (ref >60)
Glucose, Bld: 115 mg/dL — ABNORMAL HIGH (ref 70–99)
Potassium: 3.9 mmol/L (ref 3.5–5.1)
Sodium: 138 mmol/L (ref 135–145)
Total Bilirubin: 0.7 mg/dL (ref 0.0–1.2)
Total Protein: 7.9 g/dL (ref 6.5–8.1)

## 2024-04-24 LAB — CBC WITH DIFFERENTIAL/PLATELET
Abs Immature Granulocytes: 0.03 10*3/uL (ref 0.00–0.07)
Basophils Absolute: 0.1 10*3/uL (ref 0.0–0.1)
Basophils Relative: 1 %
Eosinophils Absolute: 0.2 10*3/uL (ref 0.0–0.5)
Eosinophils Relative: 2 %
HCT: 48 % — ABNORMAL HIGH (ref 36.0–46.0)
Hemoglobin: 15.6 g/dL — ABNORMAL HIGH (ref 12.0–15.0)
Immature Granulocytes: 0 %
Lymphocytes Relative: 14 %
Lymphs Abs: 1.2 10*3/uL (ref 0.7–4.0)
MCH: 29.7 pg (ref 26.0–34.0)
MCHC: 32.5 g/dL (ref 30.0–36.0)
MCV: 91.3 fL (ref 80.0–100.0)
Monocytes Absolute: 0.6 10*3/uL (ref 0.1–1.0)
Monocytes Relative: 7 %
Neutro Abs: 6.4 10*3/uL (ref 1.7–7.7)
Neutrophils Relative %: 76 %
Platelets: 783 10*3/uL — ABNORMAL HIGH (ref 150–400)
RBC: 5.26 MIL/uL — ABNORMAL HIGH (ref 3.87–5.11)
RDW: 13.9 % (ref 11.5–15.5)
WBC: 8.5 10*3/uL (ref 4.0–10.5)
nRBC: 0 % (ref 0.0–0.2)

## 2024-04-24 LAB — TECHNOLOGIST SMEAR REVIEW: Plt Morphology: INCREASED

## 2024-04-24 LAB — C-REACTIVE PROTEIN: CRP: 0.9 mg/dL (ref <1.0)

## 2024-04-24 LAB — LACTATE DEHYDROGENASE: LDH: 137 U/L (ref 98–192)

## 2024-04-24 NOTE — Assessment & Plan Note (Addendum)
#   Thrombocytosis -question essential thrombocytosis versus reactive [clinically less likely]. Patient is asymptomatic.  Long discussion with the patient regarding potential cause of the abnormal blood counts- CT:US - none.  Clinically less likely infection [?  Pessary infection per patient-suggestive of MPN/essential thrombocytosis. # I discussed the potential concerns for stroke with elevated platelets. Patient is on aspirin  81mg / 5 times a week.     For now I  recommend checking CBC;CMP jak 2 BCR ABL; MPL; CALR mutation on the peripheral blood.LDH;  Check peripheral smear. Also discussed regarding bone marrow biopsy with the above workup is inconclusive; however I would prefer not to do a bone marrow unless absolutely needed.  # Gradual weight loss- await above work up-   Thank you Dr.Feldpausch for allowing me to participate in the care of your pleasant patient. Please do not hesitate to contact me with questions or concerns in the interim.  # Patient follow-up with me in approximately 2-3 weeks to review the above results. All questions were answered. The patient knows to call the clinic with any problems, questions or concerns.   # DISPOSITION: # labs today- ordered- CBC;CMP jak 2 BCR ABL; MPL; CALR mutation on the peripheral blood.LDH;CRP # follow up in 2-3 weeks- MD; no labs- Dr.B

## 2024-04-24 NOTE — Progress Notes (Signed)
 [facility] CONSULT NOTE  Patient Care Team: Melanie Rothman, Melanie Salazar as PCP - General (Family Medicine) Melanie Leos, Melanie Salazar as Consulting Physician (Oncology)  CHIEF COMPLAINTS/PURPOSE OF CONSULTATION: Thrombocytosis.   HEMATOLOGY HISTORY  # THROMBOCYTOSIS [platelets- ; Hb; white count]; CT/US -  02/28/2024        WBC (White Blood Cell Count) 5.5 7.4  RBC (Red Blood Cell Count) 5 4.96  Hemoglobin 14.9 14.9  Hematocrit 45.4 45.4  MCV (Mean Corpuscular Volume) 90.8 91.5  MCH (Mean Corpuscular Hemoglobin) 29.8 30  MCHC (Mean Corpuscular Hemoglobin Concentration) 32.8 32.8  RDW-CV (Red Cell Distribution Width) 13.1 12.9  MPV (Mean Platelet Volume) 9.1 Low    8.6 Low      DIFFERENTIAL ?04/02/2024 - DIFFERENTIAL [facility])? Component 04/02/2024 02/28/2024      Neutrophils 4 5.8  Lymphocytes 0.8 Low    0.8 Low     Mixed Count 0.7 0.8  Neutrophil % 72.2 High    78.5 High     Lymphocyte % 14.8 10.7  Mixed % 13 10.8   Others ?04/02/2024 - Others [facility])? Component 04/02/2024 02/28/2024        EXTERNAL PATHOLOGY RESULT -- -- Load older lab results  Platelet Count 763 High          HISTORY OF PRESENTING ILLNESS: Patient ambulating-independently. /Accompanied by daughter- in-law  Melanie Salazar 89 y.o.  female pleasant patient was been referred to us  for further evaluation of elevated platelets which was incidentally found on blood work.   Patient denies any history of blood clots or strokes. Denies any burning pain or discoloration in the fingertips or toes. Patient takes aspirin  5 times a week.   Appetite is good . Gradual weight loss. NO night sweats no recurrent fevers. No cough or shortness of breath or chest pain.   Causes: Medications/steroids-splenectomy/previous blood problems/ smoking-never  Hx of pessary- concerned infection- s/p anti-biotics-every wednesday [Dr.Beastly]   Review of  Systems  Constitutional:  Positive for weight loss. Negative for chills, diaphoresis, fever and malaise/fatigue.  HENT:  Negative for nosebleeds and sore throat.   Eyes:  Negative for double vision.  Respiratory:  Negative for cough, hemoptysis, sputum production, shortness of breath and wheezing.   Cardiovascular:  Negative for chest pain, palpitations, orthopnea and leg swelling.  Gastrointestinal:  Negative for abdominal pain, blood in stool, constipation, diarrhea, heartburn, melena, nausea and vomiting.  Genitourinary:  Negative for dysuria, frequency and urgency.  Musculoskeletal:  Negative for back pain and joint pain.  Skin: Negative.  Negative for itching and rash.  Neurological:  Negative for dizziness, tingling, focal weakness, weakness and headaches.  Endo/Heme/Allergies:  Does not bruise/bleed easily.  Psychiatric/Behavioral:  Negative for depression. The patient is not nervous/anxious and does not have insomnia.      MEDICAL HISTORY:  Past Medical History:  Diagnosis Date   Arthritis    Colitis    GERD (gastroesophageal reflux disease)    Hypertension     SURGICAL HISTORY: Past Surgical History:  Procedure Laterality Date   ABDOMINAL HYSTERECTOMY     COLONOSCOPY N/A 03/27/2015   Procedure: COLONOSCOPY;  Surgeon: Stephens Eis, Melanie Salazar;  Location: [facility] ENDOSCOPY;  Service: Gastroenterology;  Laterality: N/A;   EYE SURGERY Bilateral    Cataract Extraction with IOL   FRACTURE SURGERY     REVERSE SHOULDER ARTHROPLASTY Right 04/06/2016   Procedure: REVERSE SHOULDER ARTHROPLASTY;  Surgeon: Elner Hahn, Melanie Salazar;  Location: [facility] ORS;  Service: Orthopedics;  Laterality: Right;   SHOULDER ARTHROSCOPY W/ ROTATOR CUFF REPAIR Left    WRIST FRACTURE SURGERY Left     SOCIAL HISTORY: Social History   Socioeconomic History   Marital status: Married    Spouse name: Not on file   Number of children: Not on file   Years of education: Not on file   Highest education level: Not on file   Occupational History   Not on file  Tobacco Use   Smoking status: Never   Smokeless tobacco: Never  Vaping Use   Vaping status: Never Used  Substance and Sexual Activity   Alcohol use: No   Drug use: No   Sexual activity: Not on file  Other Topics Concern   Not on file  Social History Narrative   Not on file   Social Drivers of Health   Financial Resource Strain: Low Risk  (08/24/2023)   Received from [facility]   Overall Financial Resource Strain (CARDIA)    Difficulty of Paying Living Expenses: Not hard at all  Food Insecurity: No Food Insecurity (08/24/2023)   Received from [facility]   Hunger Vital Sign    Worried About Running Out of Food in the Last Year: Never true    Ran Out of Food in the Last Year: Never true  Transportation Needs: No Transportation Needs (08/24/2023)   Received from [facility] - Transportation    In the past 12 months, has lack of transportation kept you from medical appointments or from getting medications?: No    Lack of Transportation (Non-Medical): No  Physical Activity: Not on file  Stress: Not on file  Social Connections: Not on file  Intimate Partner Violence: Not on file    FAMILY HISTORY: Family History  Problem Relation Age of Onset   Breast cancer Neg Hx     ALLERGIES:  is allergic to compazine [prochlorperazine edisylate], compazine [prochlorperazine], aleve [naproxen sodium], and penicillins.  MEDICATIONS:  Current Outpatient Medications  Medication Sig Dispense Refill   amLODipine  (NORVASC ) 5 MG tablet Take 5 mg by mouth daily.     aspirin  81 MG tablet Take 81 mg by mouth daily.     atenolol  (TENORMIN ) 100 MG tablet Take 100 mg by mouth daily.     beta carotene w/minerals (OCUVITE) tablet Take 1 tablet by mouth 3 (three) times a week.     calcium  citrate (CALCITRATE - DOSED IN MG ELEMENTAL CALCIUM ) 950 MG tablet Take 1 tablet by mouth 2 (two) times daily.       cycloSPORINE  (RESTASIS ) 0.05 % ophthalmic emulsion Place 1 drop into both eyes daily.     fluticasone  (FLONASE ) 50 MCG/ACT nasal spray Place 2 sprays into both nostrils daily.     loratadine  (CLARITIN ) 10 MG tablet Take 10 mg by mouth 3 (three) times a week.     metroNIDAZOLE (FLAGYL) 500 MG tablet Take 500 mg by mouth 2 (two) times daily.     omeprazole (PRILOSEC) 20 MG capsule Take 20 mg by mouth 3 (three) times a week. Mon, Wed, Fri     pediatric multivitamin-fluoride (POLY-VI-FLOR) 0.25 MG chewable tablet Chew 1 tablet by mouth daily.     pravastatin  (PRAVACHOL ) 40 MG tablet Take 40 mg by mouth at bedtime.      valsartan -hydrochlorothiazide  (DIOVAN -HCT) 80-12.5 MG per tablet Take 1 tablet by mouth daily.     zolpidem  (AMBIEN ) 5 MG tablet Take 5 mg by mouth at bedtime as needed  for sleep.     No current facility-administered medications for this visit.     PHYSICAL EXAMINATION:   Vitals:   04/24/24 1352  BP: (!) 154/73  Pulse: 70  Resp: (!) 24  Temp: 97.7 F (36.5 C)  SpO2: 98%   Filed Weights   04/24/24 1352  Weight: 116 lb (52.6 kg)    Physical Exam Vitals and nursing note reviewed.  HENT:     Head: Normocephalic and atraumatic.     Mouth/Throat:     Pharynx: Oropharynx is clear.  Eyes:     Extraocular Movements: Extraocular movements intact.     Pupils: Pupils are equal, round, and reactive to light.  Cardiovascular:     Rate and Rhythm: Normal rate and regular rhythm.  Pulmonary:     Comments: Decreased breath sounds bilaterally.  Abdominal:     Palpations: Abdomen is soft.  Musculoskeletal:        General: Normal range of motion.     Cervical back: Normal range of motion.  Skin:    General: Skin is warm.  Neurological:     General: No focal deficit present.     Mental Status: She is alert and oriented to person, place, and time.  Psychiatric:        Behavior: Behavior normal.        Judgment: Judgment normal.      LABORATORY DATA:  I have  reviewed the data as listed Lab Results  Component Value Date   WBC 7.8 04/07/2016   HGB 13.5 04/07/2016   HCT 38.1 04/07/2016   MCV 87.7 04/07/2016   PLT 412 04/07/2016   No results for input(s): "NA", "K", "CL", "CO2", "GLUCOSE", "BUN", "CREATININE", "CALCIUM ", "GFRNONAA", "GFRAA", "PROT", "ALBUMIN", "AST", "ALT", "ALKPHOS", "BILITOT", "BILIDIR", "IBILI" in the last 8760 hours.   No results found.  ASSESSMENT & PLAN:   Thrombocytosis # Thrombocytosis -question essential thrombocytosis versus reactive [clinically less likely]. Patient is asymptomatic.  Long discussion with the patient regarding potential cause of the abnormal blood counts- CT:US - none.  Clinically less likely infection [?  Pessary infection per patient-suggestive of MPN/essential thrombocytosis. # I discussed the potential concerns for stroke with elevated platelets. Patient is on aspirin  81mg / 5 times a week.     For now I  recommend checking CBC;CMP jak 2 BCR ABL; MPL; CALR mutation on the peripheral blood.LDH;  Check peripheral smear. Also discussed regarding bone marrow biopsy with the above workup is inconclusive; however I would prefer not to do a bone marrow unless absolutely needed.  # Gradual weight loss- await above work up-   Thank you Dr.Feldpausch for allowing me to participate in the care of your pleasant patient. Please do not hesitate to contact me with questions or concerns in the interim.  # Patient follow-up with me in approximately 2-3 weeks to review the above results. All questions were answered. The patient knows to call the clinic with any problems, questions or concerns.   # DISPOSITION: # labs today- ordered- CBC;CMP jak 2 BCR ABL; MPL; CALR mutation on the peripheral blood.LDH;CRP # follow up in 2-3 weeks- Melanie Salazar; no labs- Dr.B       Melanie Leos, Melanie Salazar 04/24/2024 2:59 PM

## 2024-04-24 NOTE — Progress Notes (Signed)
 Fatigue: NO Headaches:  NO Joint pain: NO Bleeding from gums/nose:  NO

## 2024-04-27 LAB — BCR-ABL1 FISH
Cells Analyzed: 200
Cells Counted: 200

## 2024-04-30 LAB — MPL MUTATION ANALYSIS

## 2024-04-30 LAB — JAK2 GENOTYPR

## 2024-04-30 LAB — CALRETICULIN (CALR) MUTATION ANALYSIS

## 2024-05-18 ENCOUNTER — Encounter: Payer: Self-pay | Admitting: Internal Medicine

## 2024-05-18 ENCOUNTER — Inpatient Hospital Stay: Admitting: Internal Medicine

## 2024-05-18 VITALS — BP 160/80 | HR 68 | Temp 98.0°F | Resp 18 | Ht 62.0 in | Wt 116.0 lb

## 2024-05-18 DIAGNOSIS — Z79899 Other long term (current) drug therapy: Secondary | ICD-10-CM | POA: Diagnosis not present

## 2024-05-18 DIAGNOSIS — D471 Chronic myeloproliferative disease: Secondary | ICD-10-CM | POA: Insufficient documentation

## 2024-05-18 DIAGNOSIS — Z7982 Long term (current) use of aspirin: Secondary | ICD-10-CM | POA: Diagnosis not present

## 2024-05-18 DIAGNOSIS — D75839 Thrombocytosis, unspecified: Secondary | ICD-10-CM | POA: Diagnosis not present

## 2024-05-18 MED ORDER — HYDROXYUREA 500 MG PO CAPS: ORAL_CAPSULE | ORAL | 3 refills | Status: DC

## 2024-05-18 NOTE — Progress Notes (Signed)
 [facility] CONSULT NOTE  Patient Care Team: Jeffie Cheryl BRAVO, MD as PCP - General (Family Medicine) Rennie Cindy SAUNDERS, MD as Consulting Physician (Oncology)  CHIEF COMPLAINTS/PURPOSE OF CONSULTATION: Thrombocytosis.   HEMATOLOGY HISTORY  # THROMBOCYTOSIS [platelets- ; Hb; white count]; CT/US -  02/28/2024        WBC (White Blood Cell Count) 5.5 7.4  RBC (Red Blood Cell Count) 5 4.96  Hemoglobin 14.9 14.9  Hematocrit 45.4 45.4  MCV (Mean Corpuscular Volume) 90.8 91.5  MCH (Mean Corpuscular Hemoglobin) 29.8 30  MCHC (Mean Corpuscular Hemoglobin Concentration) 32.8 32.8  RDW-CV (Red Cell Distribution Width) 13.1 12.9  MPV (Mean Platelet Volume) 9.1 Low    8.6 Low      DIFFERENTIAL ?04/02/2024 - DIFFERENTIAL [facility])? Component 04/02/2024 02/28/2024      Neutrophils 4 5.8  Lymphocytes 0.8 Low    0.8 Low     Mixed Count 0.7 0.8  Neutrophil % 72.2 High    78.5 High     Lymphocyte % 14.8 10.7  Mixed % 13 10.8   Others ?04/02/2024 - Others [facility])? Component 04/02/2024 02/28/2024        EXTERNAL PATHOLOGY RESULT -- -- Load older lab results  Platelet Count 763 High          HISTORY OF PRESENTING ILLNESS: Patient ambulating-independently. /Accompanied by daughter- in-law  Melanie Salazar 89 y.o.  female pleasant patient with no significant past medical history is here to review the blood work ordered for elevated platelets.  Patient denies any history of blood clots or strokes.  She denies any symptoms.Patient takes aspirin  5 times a week.   Appetite is good . Gradual weight loss. NO night sweats no recurrent fevers. No cough or shortness of breath or chest pain.    Review of Systems  Constitutional:  Positive for weight loss. Negative for chills, diaphoresis, fever and malaise/fatigue.  HENT:  Negative for nosebleeds and sore throat.   Eyes:  Negative for double vision.  Respiratory:   Negative for cough, hemoptysis, sputum production, shortness of breath and wheezing.   Cardiovascular:  Negative for chest pain, palpitations, orthopnea and leg swelling.  Gastrointestinal:  Negative for abdominal pain, blood in stool, constipation, diarrhea, heartburn, melena, nausea and vomiting.  Genitourinary:  Negative for dysuria, frequency and urgency.  Musculoskeletal:  Negative for back pain and joint pain.  Skin: Negative.  Negative for itching and rash.  Neurological:  Negative for dizziness, tingling, focal weakness, weakness and headaches.  Endo/Heme/Allergies:  Does not bruise/bleed easily.  Psychiatric/Behavioral:  Negative for depression. The patient is not nervous/anxious and does not have insomnia.      MEDICAL HISTORY:  Past Medical History:  Diagnosis Date   Arthritis    Colitis    GERD (gastroesophageal reflux disease)    Hypertension     SURGICAL HISTORY: Past Surgical History:  Procedure Laterality Date   ABDOMINAL HYSTERECTOMY     COLONOSCOPY N/A 03/27/2015   Procedure: COLONOSCOPY;  Surgeon: Deward CINDERELLA Piedmont, MD;  Location: [facility] ENDOSCOPY;  Service: Gastroenterology;  Laterality: N/A;   EYE SURGERY Bilateral    Cataract Extraction with IOL   FRACTURE SURGERY     REVERSE SHOULDER ARTHROPLASTY Right 04/06/2016   Procedure: REVERSE SHOULDER ARTHROPLASTY;  Surgeon: Norleen JINNY Maltos, MD;  Location: [facility] ORS;  Service: Orthopedics;  Laterality: Right;   SHOULDER ARTHROSCOPY W/ ROTATOR CUFF REPAIR Left    WRIST FRACTURE SURGERY Left     SOCIAL HISTORY:  Social History   Socioeconomic History   Marital status: Married    Spouse name: Not on file   Number of children: Not on file   Years of education: Not on file   Highest education level: Not on file  Occupational History   Not on file  Tobacco Use   Smoking status: Never   Smokeless tobacco: Never  Vaping Use   Vaping status: Never Used  Substance and Sexual Activity   Alcohol use: No   Drug use: No   Sexual  activity: Not on file  Other Topics Concern   Not on file  Social History Narrative   Not on file   Social Drivers of Health   Financial Resource Strain: Low Risk  (08/24/2023)   Received from [facility]   Overall Financial Resource Strain (CARDIA)    Difficulty of Paying Living Expenses: Not hard at all  Food Insecurity: No Food Insecurity (04/24/2024)   Hunger Vital Sign    Worried About Running Out of Food in the Last Year: Never true    Ran Out of Food in the Last Year: Never true  Transportation Needs: No Transportation Needs (04/24/2024)   PRAPARE - Administrator, Civil Service (Medical): No    Lack of Transportation (Non-Medical): No  Physical Activity: Not on file  Stress: Not on file  Social Connections: Not on file  Intimate Partner Violence: Not At Risk (04/24/2024)   Humiliation, Afraid, Rape, and Kick questionnaire    Fear of Current or Ex-Partner: No    Emotionally Abused: No    Physically Abused: No    Sexually Abused: No    FAMILY HISTORY: Family History  Problem Relation Age of Onset   Breast cancer Neg Hx     ALLERGIES:  is allergic to compazine [prochlorperazine edisylate], compazine [prochlorperazine], aleve [naproxen sodium], and penicillins.  MEDICATIONS:  Current Outpatient Medications  Medication Sig Dispense Refill   amLODipine  (NORVASC ) 5 MG tablet Take 5 mg by mouth daily.     aspirin  81 MG tablet Take 81 mg by mouth daily.     atenolol  (TENORMIN ) 100 MG tablet Take 100 mg by mouth daily.     beta carotene w/minerals (OCUVITE) tablet Take 1 tablet by mouth 3 (three) times a week.     calcium  citrate (CALCITRATE - DOSED IN MG ELEMENTAL CALCIUM ) 950 MG tablet Take 1 tablet by mouth 2 (two) times daily.      cycloSPORINE  (RESTASIS ) 0.05 % ophthalmic emulsion Place 1 drop into both eyes daily.     fluticasone  (FLONASE ) 50 MCG/ACT nasal spray Place 2 sprays into both nostrils daily.     hydroxyurea (HYDREA) 500 MG capsule  1 pill a day- Monday- Friday. Weekend-OFF; May take with food to minimize GI side effects. 30 capsule 3   loratadine  (CLARITIN ) 10 MG tablet Take 10 mg by mouth 3 (three) times a week.     metroNIDAZOLE (FLAGYL) 500 MG tablet Take 500 mg by mouth 2 (two) times daily.     omeprazole (PRILOSEC) 20 MG capsule Take 20 mg by mouth 3 (three) times a week. Mon, Wed, Fri     pediatric multivitamin-fluoride (POLY-VI-FLOR) 0.25 MG chewable tablet Chew 1 tablet by mouth daily.     pravastatin  (PRAVACHOL ) 40 MG tablet Take 40 mg by mouth at bedtime.      valsartan -hydrochlorothiazide  (DIOVAN -HCT) 80-12.5 MG per tablet Take 1 tablet by mouth daily.     zolpidem  (AMBIEN ) 5 MG tablet Take  5 mg by mouth at bedtime as needed for sleep.     No current facility-administered medications for this visit.     PHYSICAL EXAMINATION:   Vitals:   05/18/24 1507  BP: (!) 160/80  Pulse: 68  Resp: 18  Temp: 98 F (36.7 C)  SpO2: 98%    Filed Weights   05/18/24 1507  Weight: 116 lb (52.6 kg)     Physical Exam Vitals and nursing note reviewed.  HENT:     Head: Normocephalic and atraumatic.     Mouth/Throat:     Pharynx: Oropharynx is clear.   Eyes:     Extraocular Movements: Extraocular movements intact.     Pupils: Pupils are equal, round, and reactive to light.    Cardiovascular:     Rate and Rhythm: Normal rate and regular rhythm.  Pulmonary:     Comments: Decreased breath sounds bilaterally.  Abdominal:     Palpations: Abdomen is soft.   Musculoskeletal:        General: Normal range of motion.     Cervical back: Normal range of motion.   Skin:    General: Skin is warm.   Neurological:     General: No focal deficit present.     Mental Status: She is alert and oriented to person, place, and time.   Psychiatric:        Behavior: Behavior normal.        Judgment: Judgment normal.      LABORATORY DATA:  I have reviewed the data as listed Lab Results  Component Value Date   WBC  8.5 04/24/2024   HGB 15.6 (H) 04/24/2024   HCT 48.0 (H) 04/24/2024   MCV 91.3 04/24/2024   PLT 783 (H) 04/24/2024   Recent Labs    04/24/24 1459  NA 138  K 3.9  CL 101  CO2 30  GLUCOSE 115*  BUN 14  CREATININE 0.49  CALCIUM  9.4  GFRNONAA >60  PROT 7.9  ALBUMIN 4.5  AST 24  ALT 19  ALKPHOS 81  BILITOT 0.7     No results found.  ASSESSMENT & PLAN:   MPN (myeloproliferative neoplasm) (HCC) # High risk- MPN-PV/ET- JAK2 POSITIVE [JUNE 2025]; HCT- 48-; platelets 770; BCR-ABL-CALR MPL mutation negative. HOLD off  bone marrow biopsy that this time.  HOLD ultrasound for further evaluation of splenomegaly.  # I reviewed the natural history of MPN including not limited to risk of stroke and also rare transformation into acute leukemias.  In general patient would have normal life expectancy.  #  Recommend Hydrea to control  the blood counts. I discussed the potential adverse effects including but not limited to nausea vomiting diarrhea; skin rash/lower extremity ulcerations.  Would recommend close monitoring of blood counts given the risk of bone marrow suppression.  Patient reluctantly agrees to start Hydrea.  #  If hematocrit not well-controlled on Hydrea, consider phlebotomy to keep the hematocrit less than 43-45 to decrease the risk of stroke.  Recommend aspirin  81 mg daily.  # Gradual weight loss-likely secondary MPN.  # DISPOSITION: # follow up in 4-5 weeks MD; abs-cbc/cmp;LDH- Dr.B        Cindy JONELLE Joe, MD 05/18/2024 3:48 PM

## 2024-05-18 NOTE — Assessment & Plan Note (Addendum)
#   High risk- MPN-PV/ET- JAK2 POSITIVE [JUNE 2025]; HCT- 48-; platelets 770; BCR-ABL-CALR MPL mutation negative. HOLD off  bone marrow biopsy that this time.  HOLD ultrasound for further evaluation of splenomegaly.  # I reviewed the natural history of MPN including not limited to risk of stroke and also rare transformation into acute leukemias.  In general patient would have normal life expectancy.  #  Recommend Hydrea to control  the blood counts. I discussed the potential adverse effects including but not limited to nausea vomiting diarrhea; skin rash/lower extremity ulcerations.  Would recommend close monitoring of blood counts given the risk of bone marrow suppression.  Patient reluctantly agrees to start Hydrea.  #  If hematocrit not well-controlled on Hydrea, consider phlebotomy to keep the hematocrit less than 43-45 to decrease the risk of stroke.  Recommend aspirin  81 mg daily.  # Gradual weight loss-likely secondary MPN.  # DISPOSITION: # follow up in 4-5 weeks MD; abs-cbc/cmp;LDH- Dr.B

## 2024-05-18 NOTE — Progress Notes (Signed)
 Patient is doing ok, no new symptoms to report today. She is just wondering about her most recent blood work.

## 2024-06-04 DIAGNOSIS — I10 Essential (primary) hypertension: Secondary | ICD-10-CM | POA: Diagnosis not present

## 2024-06-04 DIAGNOSIS — K219 Gastro-esophageal reflux disease without esophagitis: Secondary | ICD-10-CM | POA: Diagnosis not present

## 2024-06-04 DIAGNOSIS — E7800 Pure hypercholesterolemia, unspecified: Secondary | ICD-10-CM | POA: Diagnosis not present

## 2024-06-19 ENCOUNTER — Inpatient Hospital Stay: Admitting: Internal Medicine

## 2024-06-19 ENCOUNTER — Encounter: Payer: Self-pay | Admitting: Internal Medicine

## 2024-06-19 ENCOUNTER — Inpatient Hospital Stay: Attending: Internal Medicine

## 2024-06-19 DIAGNOSIS — Z7982 Long term (current) use of aspirin: Secondary | ICD-10-CM | POA: Insufficient documentation

## 2024-06-19 DIAGNOSIS — Z79899 Other long term (current) drug therapy: Secondary | ICD-10-CM | POA: Insufficient documentation

## 2024-06-19 DIAGNOSIS — D75839 Thrombocytosis, unspecified: Secondary | ICD-10-CM | POA: Insufficient documentation

## 2024-06-19 DIAGNOSIS — D471 Chronic myeloproliferative disease: Secondary | ICD-10-CM | POA: Diagnosis not present

## 2024-06-19 DIAGNOSIS — I10 Essential (primary) hypertension: Secondary | ICD-10-CM | POA: Insufficient documentation

## 2024-06-19 LAB — CBC WITH DIFFERENTIAL (CANCER CENTER ONLY)
Abs Immature Granulocytes: 0.01 K/uL (ref 0.00–0.07)
Basophils Absolute: 0 K/uL (ref 0.0–0.1)
Basophils Relative: 1 %
Eosinophils Absolute: 0.2 K/uL (ref 0.0–0.5)
Eosinophils Relative: 3 %
HCT: 42.2 % (ref 36.0–46.0)
Hemoglobin: 14.3 g/dL (ref 12.0–15.0)
Immature Granulocytes: 0 %
Lymphocytes Relative: 19 %
Lymphs Abs: 1 K/uL (ref 0.7–4.0)
MCH: 31.6 pg (ref 26.0–34.0)
MCHC: 33.9 g/dL (ref 30.0–36.0)
MCV: 93.2 fL (ref 80.0–100.0)
Monocytes Absolute: 0.6 K/uL (ref 0.1–1.0)
Monocytes Relative: 11 %
Neutro Abs: 3.4 K/uL (ref 1.7–7.7)
Neutrophils Relative %: 66 %
Platelet Count: 508 K/uL — ABNORMAL HIGH (ref 150–400)
RBC: 4.53 MIL/uL (ref 3.87–5.11)
RDW: 15.6 % — ABNORMAL HIGH (ref 11.5–15.5)
WBC Count: 5.1 K/uL (ref 4.0–10.5)
nRBC: 0 % (ref 0.0–0.2)

## 2024-06-19 LAB — CMP (CANCER CENTER ONLY)
ALT: 15 U/L (ref 0–44)
AST: 20 U/L (ref 15–41)
Albumin: 4.1 g/dL (ref 3.5–5.0)
Alkaline Phosphatase: 66 U/L (ref 38–126)
Anion gap: 6 (ref 5–15)
BUN: 20 mg/dL (ref 8–23)
CO2: 30 mmol/L (ref 22–32)
Calcium: 9.5 mg/dL (ref 8.9–10.3)
Chloride: 99 mmol/L (ref 98–111)
Creatinine: 0.58 mg/dL (ref 0.44–1.00)
GFR, Estimated: >60 mL/min (ref >60)
Glucose, Bld: 108 mg/dL — ABNORMAL HIGH (ref 70–99)
Potassium: 4.1 mmol/L (ref 3.5–5.1)
Sodium: 135 mmol/L (ref 135–145)
Total Bilirubin: 0.6 mg/dL (ref 0.0–1.2)
Total Protein: 7.3 g/dL (ref 6.5–8.1)

## 2024-06-19 LAB — LACTATE DEHYDROGENASE: LDH: 125 U/L (ref 98–192)

## 2024-06-19 NOTE — Progress Notes (Signed)
 [facility] CONSULT NOTE  Patient Care Team: Jeffie Cheryl BRAVO, MD as PCP - General (Family Medicine) Rennie Cindy SAUNDERS, MD as Consulting Physician (Oncology)  CHIEF COMPLAINTS/PURPOSE OF CONSULTATION: Thrombocytosis.   HEMATOLOGY HISTORY  # THROMBOCYTOSIS [platelets- ; Hb; white count]; CT/US -  02/28/2024        WBC (White Blood Cell Count) 5.5 7.4  RBC (Red Blood Cell Count) 5 4.96  Hemoglobin 14.9 14.9  Hematocrit 45.4 45.4  MCV (Mean Corpuscular Volume) 90.8 91.5  MCH (Mean Corpuscular Hemoglobin) 29.8 30  MCHC (Mean Corpuscular Hemoglobin Concentration) 32.8 32.8  RDW-CV (Red Cell Distribution Width) 13.1 12.9  MPV (Mean Platelet Volume) 9.1 Low    8.6 Low      DIFFERENTIAL ?04/02/2024 - DIFFERENTIAL [facility])? Component 04/02/2024 02/28/2024      Neutrophils 4 5.8  Lymphocytes 0.8 Low    0.8 Low     Mixed Count 0.7 0.8  Neutrophil % 72.2 High    78.5 High     Lymphocyte % 14.8 10.7  Mixed % 13 10.8   Others ?04/02/2024 - Others [facility])? Component 04/02/2024 02/28/2024        EXTERNAL PATHOLOGY RESULT -- -- Load older lab results  Platelet Count 763 High          HISTORY OF PRESENTING ILLNESS: Patient ambulating-independently. /Accompanied by daughter- in-law  Melanie Salazar 89 y.o.  female pleasant patient with  ET on hydrea  is here for a follow up.  No diarrhea. Patient takes aspirin  daily. appetite is good . Gradual weight loss. NO night sweats no recurrent fevers. No cough or shortness of breath or chest pain. Complains of on going fatigue.    Review of Systems  Constitutional:  Positive for weight loss. Negative for chills, diaphoresis, fever and malaise/fatigue.  HENT:  Negative for nosebleeds and sore throat.   Eyes:  Negative for double vision.  Respiratory:  Negative for cough, hemoptysis, sputum production, shortness of breath and wheezing.   Cardiovascular:  Negative  for chest pain, palpitations, orthopnea and leg swelling.  Gastrointestinal:  Negative for abdominal pain, blood in stool, constipation, diarrhea, heartburn, melena, nausea and vomiting.  Genitourinary:  Negative for dysuria, frequency and urgency.  Musculoskeletal:  Negative for back pain and joint pain.  Skin: Negative.  Negative for itching and rash.  Neurological:  Negative for dizziness, tingling, focal weakness, weakness and headaches.  Endo/Heme/Allergies:  Does not bruise/bleed easily.  Psychiatric/Behavioral:  Negative for depression. The patient is not nervous/anxious and does not have insomnia.      MEDICAL HISTORY:  Past Medical History:  Diagnosis Date   Arthritis    Colitis    GERD (gastroesophageal reflux disease)    Hypertension     SURGICAL HISTORY: Past Surgical History:  Procedure Laterality Date   ABDOMINAL HYSTERECTOMY     COLONOSCOPY N/A 03/27/2015   Procedure: COLONOSCOPY;  Surgeon: Deward CINDERELLA Piedmont, MD;  Location: [facility] ENDOSCOPY;  Service: Gastroenterology;  Laterality: N/A;   EYE SURGERY Bilateral    Cataract Extraction with IOL   FRACTURE SURGERY     REVERSE SHOULDER ARTHROPLASTY Right 04/06/2016   Procedure: REVERSE SHOULDER ARTHROPLASTY;  Surgeon: Norleen JINNY Maltos, MD;  Location: [facility] ORS;  Service: Orthopedics;  Laterality: Right;   SHOULDER ARTHROSCOPY W/ ROTATOR CUFF REPAIR Left    WRIST FRACTURE SURGERY Left     SOCIAL HISTORY: Social History   Socioeconomic History   Marital status: Married    Spouse name: Not  on file   Number of children: Not on file   Years of education: Not on file   Highest education level: Not on file  Occupational History   Not on file  Tobacco Use   Smoking status: Never   Smokeless tobacco: Never  Vaping Use   Vaping status: Never Used  Substance and Sexual Activity   Alcohol use: No   Drug use: No   Sexual activity: Not on file  Other Topics Concern   Not on file  Social History Narrative   Not on file   Social  Drivers of Health   Financial Resource Strain: Low Risk  (08/24/2023)   Received from [facility]   Overall Financial Resource Strain (CARDIA)    Difficulty of Paying Living Expenses: Not hard at all  Food Insecurity: No Food Insecurity (04/24/2024)   Hunger Vital Sign    Worried About Running Out of Food in the Last Year: Never true    Ran Out of Food in the Last Year: Never true  Transportation Needs: No Transportation Needs (04/24/2024)   PRAPARE - Administrator, Civil Service (Medical): No    Lack of Transportation (Non-Medical): No  Physical Activity: Not on file  Stress: Not on file  Social Connections: Not on file  Intimate Partner Violence: Not At Risk (04/24/2024)   Humiliation, Afraid, Rape, and Kick questionnaire    Fear of Current or Ex-Partner: No    Emotionally Abused: No    Physically Abused: No    Sexually Abused: No    FAMILY HISTORY: Family History  Problem Relation Age of Onset   Breast cancer Neg Hx     ALLERGIES:  is allergic to compazine [prochlorperazine edisylate], compazine [prochlorperazine], aleve [naproxen sodium], and penicillins.  MEDICATIONS:  Current Outpatient Medications  Medication Sig Dispense Refill   amLODipine  (NORVASC ) 5 MG tablet Take 5 mg by mouth daily.     aspirin  81 MG tablet Take 81 mg by mouth daily.     atenolol  (TENORMIN ) 100 MG tablet Take 100 mg by mouth daily.     beta carotene w/minerals (OCUVITE) tablet Take 1 tablet by mouth 3 (three) times a week.     calcium  citrate (CALCITRATE - DOSED IN MG ELEMENTAL CALCIUM ) 950 MG tablet Take 1 tablet by mouth 2 (two) times daily.      cycloSPORINE  (RESTASIS ) 0.05 % ophthalmic emulsion Place 1 drop into both eyes daily.     fluticasone  (FLONASE ) 50 MCG/ACT nasal spray Place 2 sprays into both nostrils daily.     hydroxyurea  (HYDREA ) 500 MG capsule 1 pill a day- Monday- Friday. Weekend-OFF; May take with food to minimize GI side effects. 30 capsule 3    loratadine  (CLARITIN ) 10 MG tablet Take 10 mg by mouth 3 (three) times a week.     metroNIDAZOLE (FLAGYL) 500 MG tablet Take 500 mg by mouth 2 (two) times daily.     omeprazole (PRILOSEC) 20 MG capsule Take 20 mg by mouth 3 (three) times a week. Mon, Wed, Fri     pediatric multivitamin-fluoride (POLY-VI-FLOR) 0.25 MG chewable tablet Chew 1 tablet by mouth daily.     pravastatin  (PRAVACHOL ) 40 MG tablet Take 40 mg by mouth at bedtime.      valsartan -hydrochlorothiazide  (DIOVAN -HCT) 80-12.5 MG per tablet Take 1 tablet by mouth daily.     zolpidem  (AMBIEN ) 5 MG tablet Take 5 mg by mouth at bedtime as needed for sleep.     No current facility-administered  medications for this visit.     PHYSICAL EXAMINATION:   Vitals:   06/19/24 1506  BP: (!) 161/72  Pulse: 67  Resp: 20  Temp: 98.6 F (37 C)  SpO2: 100%     Filed Weights   06/19/24 1506  Weight: 115 lb 9.6 oz (52.4 kg)      Physical Exam Vitals and nursing note reviewed.  HENT:     Head: Normocephalic and atraumatic.     Mouth/Throat:     Pharynx: Oropharynx is clear.  Eyes:     Extraocular Movements: Extraocular movements intact.     Pupils: Pupils are equal, round, and reactive to light.  Cardiovascular:     Rate and Rhythm: Normal rate and regular rhythm.  Pulmonary:     Comments: Decreased breath sounds bilaterally.  Abdominal:     Palpations: Abdomen is soft.  Musculoskeletal:        General: Normal range of motion.     Cervical back: Normal range of motion.  Skin:    General: Skin is warm.  Neurological:     General: No focal deficit present.     Mental Status: She is alert and oriented to person, place, and time.  Psychiatric:        Behavior: Behavior normal.        Judgment: Judgment normal.      LABORATORY DATA:  I have reviewed the data as listed Lab Results  Component Value Date   WBC 5.1 06/19/2024   HGB 14.3 06/19/2024   HCT 42.2 06/19/2024   MCV 93.2 06/19/2024   PLT 508 (H) 06/19/2024    Recent Labs    04/24/24 1459 06/19/24 1455  NA 138 135  K 3.9 4.1  CL 101 99  CO2 30 30  GLUCOSE 115* 108*  BUN 14 20  CREATININE 0.49 0.58  CALCIUM  9.4 9.5  GFRNONAA >60 >60  PROT 7.9 7.3  ALBUMIN 4.5 4.1  AST 24 20  ALT 19 15  ALKPHOS 81 66  BILITOT 0.7 0.6     No results found.  ASSESSMENT & PLAN:   MPN (myeloproliferative neoplasm) (HCC) # High risk- MPN-PV/ET- JAK2 POSITIVE [JUNE 2025]; HCT- 48-; platelets 770; BCR-ABL-CALR MPL mutation negative. HOLD off  bone marrow biopsy that this time.  HOLD ultrasound for further evaluation of splenomegaly.  # patient on hydrea - tolerating well- platelets/ HCT improved- [goal < 43].  Continue aspirin  81 mg daily. Continue hydrea -at the current dose.  Patient to call us  for refills if needed.  # DISPOSITION: # follow up in 3 months MD; labs-cbc/cmp;LDH- Dr.B         Cindy JONELLE Joe, MD 06/19/2024 3:37 PM

## 2024-06-19 NOTE — Assessment & Plan Note (Addendum)
#   High risk- MPN-PV/ET- JAK2 POSITIVE [JUNE 2025]; HCT- 48-; platelets 770; BCR-ABL-CALR MPL mutation negative. HOLD off  bone marrow biopsy that this time.  HOLD ultrasound for further evaluation of splenomegaly.  # patient on hydrea - tolerating well- platelets/ HCT improved- [goal < 43].  Continue aspirin  81 mg daily. Continue hydrea -at the current dose.  Patient to call us  for refills if needed.  # DISPOSITION: # follow up in 3 months MD; labs-cbc/cmp;LDH- Dr.B

## 2024-06-19 NOTE — Progress Notes (Signed)
 Patient has no concerns

## 2024-09-07 DIAGNOSIS — E7800 Pure hypercholesterolemia, unspecified: Secondary | ICD-10-CM | POA: Diagnosis not present

## 2024-09-07 DIAGNOSIS — J301 Allergic rhinitis due to pollen: Secondary | ICD-10-CM | POA: Diagnosis not present

## 2024-09-07 DIAGNOSIS — F5101 Primary insomnia: Secondary | ICD-10-CM | POA: Diagnosis not present

## 2024-09-07 DIAGNOSIS — D471 Chronic myeloproliferative disease: Secondary | ICD-10-CM | POA: Diagnosis not present

## 2024-09-07 DIAGNOSIS — I10 Essential (primary) hypertension: Secondary | ICD-10-CM | POA: Diagnosis not present

## 2024-09-07 DIAGNOSIS — Z0000 Encounter for general adult medical examination without abnormal findings: Secondary | ICD-10-CM | POA: Diagnosis not present

## 2024-09-07 DIAGNOSIS — K219 Gastro-esophageal reflux disease without esophagitis: Secondary | ICD-10-CM | POA: Diagnosis not present

## 2024-09-13 DIAGNOSIS — N993 Prolapse of vaginal vault after hysterectomy: Secondary | ICD-10-CM | POA: Diagnosis not present

## 2024-09-17 DIAGNOSIS — H26491 Other secondary cataract, right eye: Secondary | ICD-10-CM | POA: Diagnosis not present

## 2024-09-17 DIAGNOSIS — H04123 Dry eye syndrome of bilateral lacrimal glands: Secondary | ICD-10-CM | POA: Diagnosis not present

## 2024-09-17 DIAGNOSIS — H353121 Nonexudative age-related macular degeneration, left eye, early dry stage: Secondary | ICD-10-CM | POA: Diagnosis not present

## 2024-09-17 DIAGNOSIS — D3132 Benign neoplasm of left choroid: Secondary | ICD-10-CM | POA: Diagnosis not present

## 2024-09-17 DIAGNOSIS — H353211 Exudative age-related macular degeneration, right eye, with active choroidal neovascularization: Secondary | ICD-10-CM | POA: Diagnosis not present

## 2024-09-18 ENCOUNTER — Inpatient Hospital Stay: Admitting: Nurse Practitioner

## 2024-09-18 ENCOUNTER — Inpatient Hospital Stay: Attending: Internal Medicine

## 2024-09-18 ENCOUNTER — Encounter: Payer: Self-pay | Admitting: Nurse Practitioner

## 2024-09-18 VITALS — BP 128/76 | HR 72 | Temp 98.6°F | Resp 20 | Ht 62.0 in | Wt 117.3 lb

## 2024-09-18 DIAGNOSIS — Z79899 Other long term (current) drug therapy: Secondary | ICD-10-CM

## 2024-09-18 DIAGNOSIS — Z7982 Long term (current) use of aspirin: Secondary | ICD-10-CM | POA: Diagnosis not present

## 2024-09-18 DIAGNOSIS — D45 Polycythemia vera: Secondary | ICD-10-CM | POA: Diagnosis not present

## 2024-09-18 DIAGNOSIS — D471 Chronic myeloproliferative disease: Secondary | ICD-10-CM

## 2024-09-18 LAB — CBC WITH DIFFERENTIAL (CANCER CENTER ONLY)
Abs Immature Granulocytes: 0.03 K/uL (ref 0.00–0.07)
Basophils Absolute: 0 K/uL (ref 0.0–0.1)
Basophils Relative: 1 %
Eosinophils Absolute: 0.1 K/uL (ref 0.0–0.5)
Eosinophils Relative: 1 %
HCT: 39.1 % (ref 36.0–46.0)
Hemoglobin: 13.2 g/dL (ref 12.0–15.0)
Immature Granulocytes: 1 %
Lymphocytes Relative: 12 %
Lymphs Abs: 0.8 K/uL (ref 0.7–4.0)
MCH: 34.4 pg — ABNORMAL HIGH (ref 26.0–34.0)
MCHC: 33.8 g/dL (ref 30.0–36.0)
MCV: 101.8 fL — ABNORMAL HIGH (ref 80.0–100.0)
Monocytes Absolute: 0.6 K/uL (ref 0.1–1.0)
Monocytes Relative: 10 %
Neutro Abs: 5.1 K/uL (ref 1.7–7.7)
Neutrophils Relative %: 75 %
Platelet Count: 461 K/uL — ABNORMAL HIGH (ref 150–400)
RBC: 3.84 MIL/uL — ABNORMAL LOW (ref 3.87–5.11)
RDW: 14.3 % (ref 11.5–15.5)
WBC Count: 6.7 K/uL (ref 4.0–10.5)
nRBC: 0 % (ref 0.0–0.2)

## 2024-09-18 LAB — CMP (CANCER CENTER ONLY)
ALT: 14 U/L (ref 0–44)
AST: 19 U/L (ref 15–41)
Albumin: 3.9 g/dL (ref 3.5–5.0)
Alkaline Phosphatase: 65 U/L (ref 38–126)
Anion gap: 7 (ref 5–15)
BUN: 22 mg/dL (ref 8–23)
CO2: 28 mmol/L (ref 22–32)
Calcium: 9.3 mg/dL (ref 8.9–10.3)
Chloride: 102 mmol/L (ref 98–111)
Creatinine: 0.72 mg/dL (ref 0.44–1.00)
GFR, Estimated: >60 mL/min (ref >60)
Glucose, Bld: 108 mg/dL — ABNORMAL HIGH (ref 70–99)
Potassium: 3.7 mmol/L (ref 3.5–5.1)
Sodium: 137 mmol/L (ref 135–145)
Total Bilirubin: 0.8 mg/dL (ref 0.0–1.2)
Total Protein: 7.2 g/dL (ref 6.5–8.1)

## 2024-09-18 LAB — LACTATE DEHYDROGENASE: LDH: 131 U/L (ref 98–192)

## 2024-09-18 NOTE — Progress Notes (Signed)
 [facility] CONSULT NOTE  Patient Care Team: Jeffie Cheryl BRAVO, MD as PCP - General (Family Medicine) Rennie Cindy SAUNDERS, MD as Consulting Physician (Oncology)  CHIEF COMPLAINTS/PURPOSE OF CONSULTATION: Thrombocytosis.  HEMATOLOGY HISTORY  # THROMBOCYTOSIS [platelets- ; Hb; white count]; CT/US -  02/28/2024        WBC (White Blood Cell Count) 5.5 7.4  RBC (Red Blood Cell Count) 5 4.96  Hemoglobin 14.9 14.9  Hematocrit 45.4 45.4  MCV (Mean Corpuscular Volume) 90.8 91.5  MCH (Mean Corpuscular Hemoglobin) 29.8 30  MCHC (Mean Corpuscular Hemoglobin Concentration) 32.8 32.8  RDW-CV (Red Cell Distribution Width) 13.1 12.9  MPV (Mean Platelet Volume) 9.1 Low    8.6 Low      DIFFERENTIAL ?04/02/2024 - DIFFERENTIAL [facility])? Component 04/02/2024 02/28/2024      Neutrophils 4 5.8  Lymphocytes 0.8 Low    0.8 Low     Mixed Count 0.7 0.8  Neutrophil % 72.2 High    78.5 High     Lymphocyte % 14.8 10.7  Mixed % 13 10.8   Others ?04/02/2024 - Others [facility])? Component 04/02/2024 02/28/2024        EXTERNAL PATHOLOGY RESULT -- -- Load older lab results  Platelet Count 763 High         HISTORY OF PRESENTING ILLNESS: Patient ambulating-independently. Accompanied by daughter- in-law  JACKSON FETTERS 89 y.o. female pleasant patient with  ET on hydrea  is here for a follow up.  She continues to tolerate Hydrea  5 days a week well without significant side effects.  Denies interval infections.  No diarrhea.  She continues to take aspirin  81 mg daily.  Denies night sweats.  No fevers.  No cough or shortness of breath.  Feels well and denies complaints.  Review of Systems  Constitutional:  Positive for weight loss. Negative for chills, diaphoresis, fever and malaise/fatigue.  HENT:  Negative for nosebleeds and sore throat.   Eyes:  Negative for double vision.  Respiratory:  Negative for cough, hemoptysis, sputum  production, shortness of breath and wheezing.   Cardiovascular:  Negative for chest pain, palpitations, orthopnea and leg swelling.  Gastrointestinal:  Negative for abdominal pain, blood in stool, constipation, diarrhea, heartburn, melena, nausea and vomiting.  Genitourinary:  Negative for dysuria, frequency and urgency.  Musculoskeletal:  Negative for back pain and joint pain.  Skin: Negative.  Negative for itching and rash.  Neurological:  Negative for dizziness, tingling, focal weakness, weakness and headaches.  Endo/Heme/Allergies:  Does not bruise/bleed easily.  Psychiatric/Behavioral:  Negative for depression. The patient is not nervous/anxious and does not have insomnia.      MEDICAL HISTORY:  Past Medical History:  Diagnosis Date   Arthritis    Colitis    GERD (gastroesophageal reflux disease)    Hypertension     SURGICAL HISTORY: Past Surgical History:  Procedure Laterality Date   ABDOMINAL HYSTERECTOMY     COLONOSCOPY N/A 03/27/2015   Procedure: COLONOSCOPY;  Surgeon: Deward CINDERELLA Piedmont, MD;  Location: [facility] ENDOSCOPY;  Service: Gastroenterology;  Laterality: N/A;   EYE SURGERY Bilateral    Cataract Extraction with IOL   FRACTURE SURGERY     REVERSE SHOULDER ARTHROPLASTY Right 04/06/2016   Procedure: REVERSE SHOULDER ARTHROPLASTY;  Surgeon: Norleen JINNY Maltos, MD;  Location: [facility] ORS;  Service: Orthopedics;  Laterality: Right;   SHOULDER ARTHROSCOPY W/ ROTATOR CUFF REPAIR Left    WRIST FRACTURE SURGERY Left     SOCIAL HISTORY: Social History   Socioeconomic  History   Marital status: Married    Spouse name: Not on file   Number of children: Not on file   Years of education: Not on file   Highest education level: Not on file  Occupational History   Not on file  Tobacco Use   Smoking status: Never   Smokeless tobacco: Never  Vaping Use   Vaping status: Never Used  Substance and Sexual Activity   Alcohol use: No   Drug use: No   Sexual activity: Not on file  Other Topics  Concern   Not on file  Social History Narrative   Not on file   Social Drivers of Health   Financial Resource Strain: Low Risk  (09/07/2024)   Received from [facility]   Overall Financial Resource Strain (CARDIA)    Difficulty of Paying Living Expenses: Not hard at all  Food Insecurity: No Food Insecurity (09/07/2024)   Received from [facility]   Hunger Vital Sign    Within the past 12 months, you worried that your food would run out before you got the money to buy more.: Never true    Within the past 12 months, the food you bought just didn't last and you didn't have money to get more.: Never true  Transportation Needs: No Transportation Needs (09/07/2024)   Received from [facility]    In the past 12 months, has lack of transportation kept you from medical appointments or from getting medications?: No    Lack of Transportation (Non-Medical): No  Physical Activity: Not on file  Stress: Not on file  Social Connections: Not on file  Intimate Partner Violence: Not At Risk (04/24/2024)   Humiliation, Afraid, Rape, and Kick questionnaire    Fear of Current or Ex-Partner: No    Emotionally Abused: No    Physically Abused: No    Sexually Abused: No    FAMILY HISTORY: Family History  Problem Relation Age of Onset   Breast cancer Neg Hx     ALLERGIES:  is allergic to compazine [prochlorperazine edisylate], compazine [prochlorperazine], aleve [naproxen sodium], and penicillins.  MEDICATIONS:  Current Outpatient Medications  Medication Sig Dispense Refill   amLODipine  (NORVASC ) 5 MG tablet Take 5 mg by mouth daily.     aspirin  81 MG tablet Take 81 mg by mouth daily.     atenolol  (TENORMIN ) 100 MG tablet Take 100 mg by mouth daily.     beta carotene w/minerals (OCUVITE) tablet Take 1 tablet by mouth 3 (three) times a week.     calcium  citrate (CALCITRATE - DOSED IN MG ELEMENTAL CALCIUM ) 950 MG tablet  Take 1 tablet by mouth 2 (two) times daily.      cycloSPORINE  (RESTASIS ) 0.05 % ophthalmic emulsion Place 1 drop into both eyes daily.     fluticasone  (FLONASE ) 50 MCG/ACT nasal spray Place 2 sprays into both nostrils daily. (Patient taking differently: Place 2 sprays into both nostrils daily as needed.)     hydroxyurea  (HYDREA ) 500 MG capsule 1 pill a day- Monday- Friday. Weekend-OFF; May take with food to minimize GI side effects. 30 capsule 3   loratadine  (CLARITIN ) 10 MG tablet Take 10 mg by mouth 3 (three) times a week.     omeprazole (PRILOSEC) 20 MG capsule Take 20 mg by mouth 3 (three) times a week. Mon, Wed, Fri     pediatric multivitamin-fluoride (POLY-VI-FLOR) 0.25 MG chewable tablet Chew 1 tablet by mouth daily.  pravastatin  (PRAVACHOL ) 40 MG tablet Take 40 mg by mouth at bedtime.      valsartan -hydrochlorothiazide  (DIOVAN -HCT) 80-12.5 MG per tablet Take 1 tablet by mouth daily.     zolpidem  (AMBIEN ) 5 MG tablet Take 5 mg by mouth at bedtime as needed for sleep.     metroNIDAZOLE (FLAGYL) 500 MG tablet Take 500 mg by mouth 2 (two) times daily. (Patient not taking: Reported on 09/18/2024)     No current facility-administered medications for this visit.     PHYSICAL EXAMINATION: Vitals:   09/18/24 1443  BP: 128/76  Pulse: 72  Resp: 20  Temp: 98.6 F (37 C)  SpO2: 99%   Filed Weights   09/18/24 1443  Weight: 117 lb 4.8 oz (53.2 kg)   Physical Exam Constitutional:      Appearance: She is not ill-appearing.  Abdominal:     Palpations: Abdomen is soft.     Tenderness: There is no abdominal tenderness. There is no guarding.  Musculoskeletal:        General: No deformity.     Right lower leg: No edema.     Left lower leg: No edema.  Lymphadenopathy:     Cervical: No cervical adenopathy.  Skin:    General: Skin is warm and dry.     Findings: No rash.  Neurological:     Mental Status: She is alert and oriented to person, place, and time.  Psychiatric:        Mood  and Affect: Mood normal.        Behavior: Behavior normal.    LABORATORY DATA:  I have reviewed the data as listed Lab Results  Component Value Date   WBC 6.7 09/18/2024   HGB 13.2 09/18/2024   HCT 39.1 09/18/2024   MCV 101.8 (H) 09/18/2024   PLT 461 (H) 09/18/2024   Recent Labs    04/24/24 1459 06/19/24 1455 09/18/24 1439  NA 138 135 137  K 3.9 4.1 3.7  CL 101 99 102  CO2 30 30 28   GLUCOSE 115* 108* 108*  BUN 14 20 22   CREATININE 0.49 0.58 0.72  CALCIUM  9.4 9.5 9.3  GFRNONAA >60 >60 >60  PROT 7.9 7.3 7.2  ALBUMIN 4.5 4.1 3.9  AST 24 20 19   ALT 19 15 14   ALKPHOS 81 66 65  BILITOT 0.7 0.6 0.8   No results found.  ASSESSMENT & PLAN:   MPN (myeloproliferative neoplasm) (HCC) # High risk- MPN-PV/ET- JAK2 POSITIVE [JUNE 2025]; HCT- 48-; platelets 770; BCR-ABL-CALR MPL mutation negative. HOLD off  bone marrow biopsy that this time.  HOLD ultrasound for further evaluation of splenomegaly.   # Patient on hydrea - tolerating well. Goal HCT < 43, Platelets improving. Now 461. Tolerating hydrea  5 days a week well. Continue aspirin  81 mg daily. Continue hydrea  at current dose.   # DISPOSITION: 3 mo- lab (cbc, cmp, ldh), Dr Rennie- la   No problem-specific Assessment & Plan notes found for this encounter.    Tinnie KANDICE Dawn, NP 09/18/2024

## 2024-09-18 NOTE — Progress Notes (Signed)
 Fell yesterday, feet got tangled, no injuries. 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 Would this have anything to do with her high platelets? Will be going to [facility] for injections.

## 2024-09-19 ENCOUNTER — Other Ambulatory Visit

## 2024-09-19 ENCOUNTER — Ambulatory Visit: Admitting: Internal Medicine

## 2024-09-20 DIAGNOSIS — D3132 Benign neoplasm of left choroid: Secondary | ICD-10-CM | POA: Diagnosis not present

## 2024-09-20 DIAGNOSIS — H43813 Vitreous degeneration, bilateral: Secondary | ICD-10-CM | POA: Diagnosis not present

## 2024-09-20 DIAGNOSIS — Z961 Presence of intraocular lens: Secondary | ICD-10-CM | POA: Diagnosis not present

## 2024-09-20 DIAGNOSIS — H353211 Exudative age-related macular degeneration, right eye, with active choroidal neovascularization: Secondary | ICD-10-CM | POA: Diagnosis not present

## 2024-09-20 DIAGNOSIS — H353122 Nonexudative age-related macular degeneration, left eye, intermediate dry stage: Secondary | ICD-10-CM | POA: Diagnosis not present

## 2024-10-09 ENCOUNTER — Other Ambulatory Visit: Payer: Self-pay

## 2024-10-09 DIAGNOSIS — D471 Chronic myeloproliferative disease: Secondary | ICD-10-CM

## 2024-10-09 MED ORDER — HYDROXYUREA 500 MG PO CAPS
ORAL_CAPSULE | ORAL | 1 refills | Status: AC
Start: 2024-10-09 — End: ?

## 2024-10-24 DIAGNOSIS — H353211 Exudative age-related macular degeneration, right eye, with active choroidal neovascularization: Secondary | ICD-10-CM | POA: Diagnosis not present

## 2024-10-24 DIAGNOSIS — H353122 Nonexudative age-related macular degeneration, left eye, intermediate dry stage: Secondary | ICD-10-CM | POA: Diagnosis not present

## 2024-10-24 DIAGNOSIS — D3132 Benign neoplasm of left choroid: Secondary | ICD-10-CM | POA: Diagnosis not present

## 2024-10-24 DIAGNOSIS — H43813 Vitreous degeneration, bilateral: Secondary | ICD-10-CM | POA: Diagnosis not present

## 2024-10-24 DIAGNOSIS — Z961 Presence of intraocular lens: Secondary | ICD-10-CM | POA: Diagnosis not present

## 2024-12-13 ENCOUNTER — Telehealth: Payer: Self-pay | Admitting: Internal Medicine

## 2024-12-13 NOTE — Telephone Encounter (Signed)
 Pt called to r/s due to weather - pt confirmed new date/time - LH

## 2024-12-18 ENCOUNTER — Inpatient Hospital Stay

## 2024-12-18 ENCOUNTER — Inpatient Hospital Stay: Admitting: Nurse Practitioner

## 2024-12-25 ENCOUNTER — Encounter: Payer: Self-pay | Admitting: Internal Medicine

## 2024-12-25 ENCOUNTER — Inpatient Hospital Stay: Admitting: Internal Medicine

## 2024-12-25 ENCOUNTER — Inpatient Hospital Stay

## 2024-12-25 VITALS — BP 132/77 | HR 65 | Temp 96.7°F | Resp 20 | Ht 62.0 in | Wt 114.6 lb

## 2024-12-25 DIAGNOSIS — D471 Chronic myeloproliferative disease: Secondary | ICD-10-CM | POA: Diagnosis not present

## 2024-12-25 LAB — CBC WITH DIFFERENTIAL (CANCER CENTER ONLY)
Abs Immature Granulocytes: 0.02 10*3/uL (ref 0.00–0.07)
Basophils Absolute: 0 10*3/uL (ref 0.0–0.1)
Basophils Relative: 0 %
Eosinophils Absolute: 0.1 10*3/uL (ref 0.0–0.5)
Eosinophils Relative: 1 %
HCT: 43.5 % (ref 36.0–46.0)
Hemoglobin: 14.5 g/dL (ref 12.0–15.0)
Immature Granulocytes: 0 %
Lymphocytes Relative: 13 %
Lymphs Abs: 0.9 10*3/uL (ref 0.7–4.0)
MCH: 34.3 pg — ABNORMAL HIGH (ref 26.0–34.0)
MCHC: 33.3 g/dL (ref 30.0–36.0)
MCV: 102.8 fL — ABNORMAL HIGH (ref 80.0–100.0)
Monocytes Absolute: 0.7 10*3/uL (ref 0.1–1.0)
Monocytes Relative: 10 %
Neutro Abs: 5.3 10*3/uL (ref 1.7–7.7)
Neutrophils Relative %: 76 %
Platelet Count: 431 10*3/uL — ABNORMAL HIGH (ref 150–400)
RBC: 4.23 MIL/uL (ref 3.87–5.11)
RDW: 13.3 % (ref 11.5–15.5)
WBC Count: 7 10*3/uL (ref 4.0–10.5)
nRBC: 0 % (ref 0.0–0.2)

## 2024-12-25 LAB — CMP (CANCER CENTER ONLY)
ALT: 11 U/L (ref 0–44)
AST: 21 U/L (ref 15–41)
Albumin: 4.4 g/dL (ref 3.5–5.0)
Alkaline Phosphatase: 83 U/L (ref 38–126)
Anion gap: 9 (ref 5–15)
BUN: 17 mg/dL (ref 8–23)
CO2: 31 mmol/L (ref 22–32)
Calcium: 10.1 mg/dL (ref 8.9–10.3)
Chloride: 100 mmol/L (ref 98–111)
Creatinine: 0.67 mg/dL (ref 0.44–1.00)
GFR, Estimated: >60 mL/min
Glucose, Bld: 94 mg/dL (ref 70–99)
Potassium: 3.6 mmol/L (ref 3.5–5.1)
Sodium: 139 mmol/L (ref 135–145)
Total Bilirubin: 0.6 mg/dL (ref 0.0–1.2)
Total Protein: 7.6 g/dL (ref 6.5–8.1)

## 2024-12-25 LAB — LACTATE DEHYDROGENASE: LDH: 174 U/L (ref 105–235)

## 2024-12-25 NOTE — Progress Notes (Signed)
 Pt would like to know lab numbers.

## 2024-12-25 NOTE — Assessment & Plan Note (Signed)
#   High risk- MPN-PV/ET- JAK2 POSITIVE [JUNE 2025]; HCT- 48-; platelets 770; BCR-ABL-CALR MPL mutation negative. HOLD off  bone marrow biopsy that this time.  HOLD ultrasound for further evaluation of splenomegaly. patient on hydrea /asprin.   #  Tolerating well- platelets/ HCT improved- [goal < 43].  Continue aspirin  81 mg daily. Continue hydrea  500 mg/day M-F; weekend off; continue the current dose.  Patient to call us  for refills if needed.  # DISPOSITION: # follow up in 6 months MD; labs-cbc/cmp;LDH- Dr.B

## 2025-06-27 ENCOUNTER — Inpatient Hospital Stay: Admitting: Internal Medicine

## 2025-06-27 ENCOUNTER — Inpatient Hospital Stay
